# Patient Record
Sex: Female | Born: 1950 | Race: Black or African American | Hispanic: No | Smoking: Former smoker
Health system: Southern US, Community
[De-identification: ages and names within clinical notes are randomized; demographics above are authoritative.]

## PROBLEM LIST (undated history)

## (undated) DIAGNOSIS — F0390 Unspecified dementia without behavioral disturbance: Secondary | ICD-10-CM

## (undated) DIAGNOSIS — R4701 Aphasia: Secondary | ICD-10-CM

## (undated) DIAGNOSIS — G40909 Epilepsy, unspecified, not intractable, without status epilepticus: Secondary | ICD-10-CM

## (undated) DIAGNOSIS — I119 Hypertensive heart disease without heart failure: Secondary | ICD-10-CM

## (undated) DIAGNOSIS — I739 Peripheral vascular disease, unspecified: Secondary | ICD-10-CM

## (undated) DIAGNOSIS — E669 Obesity, unspecified: Secondary | ICD-10-CM

## (undated) DIAGNOSIS — E119 Type 2 diabetes mellitus without complications: Secondary | ICD-10-CM

## (undated) DIAGNOSIS — R1310 Dysphagia, unspecified: Secondary | ICD-10-CM

## (undated) DIAGNOSIS — F329 Major depressive disorder, single episode, unspecified: Secondary | ICD-10-CM

## (undated) DIAGNOSIS — R532 Functional quadriplegia: Secondary | ICD-10-CM

## (undated) DIAGNOSIS — D649 Anemia, unspecified: Secondary | ICD-10-CM

## (undated) DIAGNOSIS — E785 Hyperlipidemia, unspecified: Secondary | ICD-10-CM

---

## 2017-05-11 DIAGNOSIS — I639 Cerebral infarction, unspecified: Secondary | ICD-10-CM

## 2017-05-11 HISTORY — DX: Cerebral infarction, unspecified: I63.9

## 2017-09-02 ENCOUNTER — Other Ambulatory Visit: Payer: Self-pay

## 2017-09-02 ENCOUNTER — Encounter (HOSPITAL_COMMUNITY): Payer: Self-pay

## 2017-09-02 ENCOUNTER — Inpatient Hospital Stay (HOSPITAL_COMMUNITY)
Admission: AD | Admit: 2017-09-02 | Discharge: 2017-09-14 | DRG: 982 | Disposition: A | Discharge status: Skilled Nursing Facility | Payer: Medicare (Managed Care) | Source: Other Acute Inpatient Hospital | Attending: Internal Medicine | Admitting: Internal Medicine

## 2017-09-02 ENCOUNTER — Inpatient Hospital Stay (HOSPITAL_COMMUNITY): Payer: Medicare (Managed Care)

## 2017-09-02 DIAGNOSIS — G40409 Other generalized epilepsy and epileptic syndromes, not intractable, without status epilepticus: Secondary | ICD-10-CM | POA: Diagnosis present

## 2017-09-02 DIAGNOSIS — Z87440 Personal history of urinary (tract) infections: Secondary | ICD-10-CM | POA: Diagnosis not present

## 2017-09-02 DIAGNOSIS — L89159 Pressure ulcer of sacral region, unspecified stage: Secondary | ICD-10-CM | POA: Diagnosis present

## 2017-09-02 DIAGNOSIS — R4701 Aphasia: Secondary | ICD-10-CM | POA: Diagnosis not present

## 2017-09-02 DIAGNOSIS — G931 Anoxic brain damage, not elsewhere classified: Secondary | ICD-10-CM | POA: Diagnosis present

## 2017-09-02 DIAGNOSIS — Z9049 Acquired absence of other specified parts of digestive tract: Secondary | ICD-10-CM

## 2017-09-02 DIAGNOSIS — R569 Unspecified convulsions: Secondary | ICD-10-CM

## 2017-09-02 DIAGNOSIS — I340 Nonrheumatic mitral (valve) insufficiency: Secondary | ICD-10-CM | POA: Diagnosis not present

## 2017-09-02 DIAGNOSIS — I6330 Cerebral infarction due to thrombosis of unspecified cerebral artery: Secondary | ICD-10-CM | POA: Diagnosis not present

## 2017-09-02 DIAGNOSIS — Z888 Allergy status to other drugs, medicaments and biological substances status: Secondary | ICD-10-CM | POA: Diagnosis not present

## 2017-09-02 DIAGNOSIS — I639 Cerebral infarction, unspecified: Principal | ICD-10-CM

## 2017-09-02 DIAGNOSIS — G253 Myoclonus: Secondary | ICD-10-CM | POA: Diagnosis present

## 2017-09-02 DIAGNOSIS — I2510 Atherosclerotic heart disease of native coronary artery without angina pectoris: Secondary | ICD-10-CM | POA: Diagnosis not present

## 2017-09-02 DIAGNOSIS — Z6841 Body Mass Index (BMI) 40.0 and over, adult: Secondary | ICD-10-CM | POA: Diagnosis not present

## 2017-09-02 DIAGNOSIS — I130 Hypertensive heart and chronic kidney disease with heart failure and stage 1 through stage 4 chronic kidney disease, or unspecified chronic kidney disease: Secondary | ICD-10-CM | POA: Diagnosis not present

## 2017-09-02 DIAGNOSIS — E669 Obesity, unspecified: Secondary | ICD-10-CM | POA: Diagnosis not present

## 2017-09-02 DIAGNOSIS — G40401 Other generalized epilepsy and epileptic syndromes, not intractable, with status epilepticus: Secondary | ICD-10-CM | POA: Diagnosis not present

## 2017-09-02 DIAGNOSIS — Z8701 Personal history of pneumonia (recurrent): Secondary | ICD-10-CM | POA: Diagnosis not present

## 2017-09-02 DIAGNOSIS — E1151 Type 2 diabetes mellitus with diabetic peripheral angiopathy without gangrene: Secondary | ICD-10-CM | POA: Diagnosis present

## 2017-09-02 DIAGNOSIS — G252 Other specified forms of tremor: Secondary | ICD-10-CM | POA: Diagnosis not present

## 2017-09-02 DIAGNOSIS — Z91041 Radiographic dye allergy status: Secondary | ICD-10-CM

## 2017-09-02 DIAGNOSIS — N183 Chronic kidney disease, stage 3 (moderate): Secondary | ICD-10-CM | POA: Diagnosis not present

## 2017-09-02 DIAGNOSIS — I5022 Chronic systolic (congestive) heart failure: Secondary | ICD-10-CM | POA: Diagnosis present

## 2017-09-02 DIAGNOSIS — G8220 Paraplegia, unspecified: Secondary | ICD-10-CM | POA: Diagnosis not present

## 2017-09-02 DIAGNOSIS — Z931 Gastrostomy status: Secondary | ICD-10-CM | POA: Diagnosis not present

## 2017-09-02 DIAGNOSIS — Z91018 Allergy to other foods: Secondary | ICD-10-CM | POA: Diagnosis not present

## 2017-09-02 DIAGNOSIS — E1122 Type 2 diabetes mellitus with diabetic chronic kidney disease: Secondary | ICD-10-CM | POA: Diagnosis present

## 2017-09-02 DIAGNOSIS — D649 Anemia, unspecified: Secondary | ICD-10-CM | POA: Diagnosis present

## 2017-09-02 DIAGNOSIS — J45909 Unspecified asthma, uncomplicated: Secondary | ICD-10-CM | POA: Diagnosis present

## 2017-09-02 DIAGNOSIS — G9340 Encephalopathy, unspecified: Secondary | ICD-10-CM | POA: Diagnosis not present

## 2017-09-02 DIAGNOSIS — R1310 Dysphagia, unspecified: Secondary | ICD-10-CM | POA: Diagnosis not present

## 2017-09-02 DIAGNOSIS — Z7401 Bed confinement status: Secondary | ICD-10-CM

## 2017-09-02 DIAGNOSIS — R52 Pain, unspecified: Secondary | ICD-10-CM

## 2017-09-02 DIAGNOSIS — Z91013 Allergy to seafood: Secondary | ICD-10-CM

## 2017-09-02 DIAGNOSIS — Z751 Person awaiting admission to adequate facility elsewhere: Secondary | ICD-10-CM

## 2017-09-02 DIAGNOSIS — E785 Hyperlipidemia, unspecified: Secondary | ICD-10-CM | POA: Diagnosis present

## 2017-09-02 DIAGNOSIS — R29721 NIHSS score 21: Secondary | ICD-10-CM | POA: Diagnosis present

## 2017-09-02 DIAGNOSIS — R4182 Altered mental status, unspecified: Secondary | ICD-10-CM

## 2017-09-02 LAB — CBC WITH DIFFERENTIAL/PLATELET
BASOS ABS: 0.1 10*3/uL (ref 0.0–0.1)
Basophils Relative: 1 %
Eosinophils Absolute: 0.8 10*3/uL — ABNORMAL HIGH (ref 0.0–0.7)
Eosinophils Relative: 8 %
HEMATOCRIT: 33.8 % — AB (ref 36.0–46.0)
HEMOGLOBIN: 10.6 g/dL — AB (ref 12.0–15.0)
Lymphocytes Relative: 35 %
Lymphs Abs: 3.4 10*3/uL (ref 0.7–4.0)
MCH: 26.6 pg (ref 26.0–34.0)
MCHC: 31.4 g/dL (ref 30.0–36.0)
MCV: 84.7 fL (ref 78.0–100.0)
Monocytes Absolute: 0.8 10*3/uL (ref 0.1–1.0)
Monocytes Relative: 8 %
NEUTROS ABS: 4.8 10*3/uL (ref 1.7–7.7)
NEUTROS PCT: 48 %
PLATELETS: 390 10*3/uL (ref 150–400)
RBC: 3.99 MIL/uL (ref 3.87–5.11)
RDW: 15.1 % (ref 11.5–15.5)
WBC: 9.8 10*3/uL (ref 4.0–10.5)

## 2017-09-02 LAB — MRSA PCR SCREENING: MRSA by PCR: POSITIVE — AB

## 2017-09-02 LAB — COMPREHENSIVE METABOLIC PANEL
ALBUMIN: 3.1 g/dL — AB (ref 3.5–5.0)
ALT: 12 U/L — ABNORMAL LOW (ref 14–54)
ANION GAP: 10 (ref 5–15)
AST: 15 U/L (ref 15–41)
Alkaline Phosphatase: 94 U/L (ref 38–126)
BUN: 14 mg/dL (ref 6–20)
CHLORIDE: 102 mmol/L (ref 101–111)
CO2: 26 mmol/L (ref 22–32)
Calcium: 9.7 mg/dL (ref 8.9–10.3)
Creatinine, Ser: 0.71 mg/dL (ref 0.44–1.00)
GFR calc non Af Amer: >60 mL/min (ref >60)
Glucose, Bld: 173 mg/dL — ABNORMAL HIGH (ref 65–99)
Potassium: 3.8 mmol/L (ref 3.5–5.1)
SODIUM: 138 mmol/L (ref 135–145)
Total Bilirubin: 0.7 mg/dL (ref 0.3–1.2)
Total Protein: 7.2 g/dL (ref 6.5–8.1)

## 2017-09-02 LAB — PROTIME-INR
INR: 1.01
Prothrombin Time: 13.2 seconds (ref 11.4–15.2)

## 2017-09-02 LAB — MAGNESIUM: Magnesium: 1.7 mg/dL (ref 1.7–2.4)

## 2017-09-02 LAB — GLUCOSE, CAPILLARY
GLUCOSE-CAPILLARY: 203 mg/dL — AB (ref 65–99)
Glucose-Capillary: 166 mg/dL — ABNORMAL HIGH (ref 65–99)

## 2017-09-02 LAB — PHOSPHORUS: PHOSPHORUS: 4 mg/dL (ref 2.5–4.6)

## 2017-09-02 LAB — CK: CK TOTAL: 34 U/L — AB (ref 38–234)

## 2017-09-02 LAB — AMMONIA: AMMONIA: 21 umol/L (ref 9–35)

## 2017-09-02 MED ORDER — LORAZEPAM 2 MG/ML IJ SOLN
2.0000 mg | Freq: Once | INTRAMUSCULAR | Status: DC | PRN
Start: 2017-09-02 — End: 2017-09-14

## 2017-09-02 MED ORDER — HEPARIN SODIUM (PORCINE) 5000 UNIT/ML IJ SOLN
5000.0000 [IU] | Freq: Three times a day (TID) | INTRAMUSCULAR | Status: DC
  Administered 2017-09-02 – 2017-09-14 (×36): 5000 [IU] via SUBCUTANEOUS
  Filled 2017-09-02 (×36): qty 1

## 2017-09-02 MED ORDER — IPRATROPIUM-ALBUTEROL 0.5-2.5 (3) MG/3ML IN SOLN: 3.0000 mL | Freq: Four times a day (QID) | RESPIRATORY_TRACT | Status: DC | PRN

## 2017-09-02 MED ORDER — ATORVASTATIN CALCIUM 20 MG PO TABS
20.0000 mg | ORAL_TABLET | Freq: Every day | ORAL | Status: DC
  Administered 2017-09-03 – 2017-09-04 (×2): 20 mg via ORAL
  Filled 2017-09-02 (×2): qty 1

## 2017-09-02 MED ORDER — CARVEDILOL 3.125 MG PO TABS
3.1250 mg | ORAL_TABLET | Freq: Two times a day (BID) | ORAL | Status: DC
  Administered 2017-09-03 – 2017-09-06 (×7): 3.125 mg
  Filled 2017-09-02 (×7): qty 1

## 2017-09-02 MED ORDER — LEVETIRACETAM IN NACL 1000 MG/100ML IV SOLN
1000.0000 mg | Freq: Two times a day (BID) | INTRAVENOUS | Status: DC
  Administered 2017-09-02 – 2017-09-08 (×12): 1000 mg via INTRAVENOUS
  Filled 2017-09-02 (×13): qty 100

## 2017-09-02 MED ORDER — MONTELUKAST SODIUM 10 MG PO TABS
10.0000 mg | ORAL_TABLET | Freq: Every day | ORAL | Status: DC
  Administered 2017-09-03 – 2017-09-13 (×12): 10 mg
  Filled 2017-09-02 (×12): qty 1

## 2017-09-02 MED ORDER — INSULIN ASPART 100 UNIT/ML ~~LOC~~ SOLN
1.0000 [IU] | SUBCUTANEOUS | Status: DC
  Administered 2017-09-03 (×2): 2 [IU] via SUBCUTANEOUS
  Administered 2017-09-03: 1 [IU] via SUBCUTANEOUS
  Administered 2017-09-03 (×2): 2 [IU] via SUBCUTANEOUS
  Administered 2017-09-04 (×3): 1 [IU] via SUBCUTANEOUS
  Administered 2017-09-04: 2 [IU] via SUBCUTANEOUS
  Administered 2017-09-05 (×2): 1 [IU] via SUBCUTANEOUS
  Administered 2017-09-05: 2 [IU] via SUBCUTANEOUS
  Administered 2017-09-05: 1 [IU] via SUBCUTANEOUS
  Administered 2017-09-06 (×7): 2 [IU] via SUBCUTANEOUS
  Administered 2017-09-07: 3 [IU] via SUBCUTANEOUS
  Administered 2017-09-07: 2 [IU] via SUBCUTANEOUS
  Administered 2017-09-07: 3 [IU] via SUBCUTANEOUS
  Administered 2017-09-07: 2 [IU] via SUBCUTANEOUS

## 2017-09-02 NOTE — H&P (Signed)
PULMONARY / CRITICAL CARE MEDICINE   Name: Christine Franklin MRN: [id number] DOB: 11/09/1950    ADMISSION DATE:  09/02/2017 CONSULTATION DATE:  09/02/17  REFERRING MD:  Dr. Julio Sickssei-Bonsu at [facility]  CHIEF COMPLAINT:  Acute encephalopathy, concern for SLSE  HISTORY OF PRESENT ILLNESS:   HPI obtained from from patient's daughter and medical records as patient is encephalopathic and unable to provide history.   67 year old female with past medical significant for DM, CAD, CKD stage 3, HLD, obesity who presents from [facility] with concern for subclinical status epilepticus.   Patient lived and functioned independently prior to being originally hospitalized in January after being found unresponsive on her CPAP requiring intubation and treatment for pneumonia.  Since that time she has been admitted and intubated twice being treated for sepsis related to pneumonia and UTI.  Since January, she has continued to have this ongoing encephalopathy which waxes and wanes per her daughter, medical records note some concern for anoxic injury.     Patient was in general state of health today with her daughter was visiting, when she yelled out, looked to her right and started generalized jerking for around 2 minutes.  This was not witnessed by staff however patient remained altered from her baseline mental status and non-verbal.  No recent known fever or other complaints.  Her daughter states she is intermittently able to communicate and speak in sentences and follow commands at baseline; [facility] notes she is at baseline oriented to self, intermittently follows commands, and moves all extremities.  Following event she was taken for head CT which was negative, transferred to [facility] ICU, started on Keppra 500 mg  BID and EEG performed.  Results of EEG unknown.  [facility] in concern for status epilepticus as patient has been unable to return to her baseline mental status.  PCCM to  admit.  PAST MEDICAL HISTORY :  She  has no past medical history on file.  PAST SURGICAL HISTORY: She  has no past surgical history on file.  Allergies not on file  No current facility-administered medications on file prior to encounter.    No current outpatient medications on file prior to encounter.    FAMILY HISTORY:  Her has no family status information on file.    SOCIAL HISTORY: Unable  REVIEW OF SYSTEMS:   Unable  SUBJECTIVE:   VITAL SIGNS: BP (!) 82/61 (BP Location: Right Leg)   Pulse (!) 101   Temp 99.2 F (37.3 C) (Axillary)   Resp 15   Ht 5\' 6"  (1.676 m)   Wt 228 lb 6.3 oz (103.6 kg)   SpO2 100%   BMI 36.86 kg/m   HEMODYNAMICS:    VENTILATOR SETTINGS:    INTAKE / OUTPUT: No intake/output data recorded.  PHYSICAL EXAMINATION: General:  Chronically ill appearing elderly female lying in bed in NAD HEENT: MM pink/moist, pupils 4 reactive, anicteric, no JVD Neuro: Awake, vocalizes "yes ma'am" only to all questions, does not follow commands, moves all extremities spontaneously, questionably patient will not cross midline to right, family states she favors her left side.   CV  rrr, no m/r/g PULM: even/non-labored on 2L [place] at 100%, lungs bilaterally clear anteriorly, diminished in bases GI: obese, soft, non-tender, bs active, Gtube  Extremities: warm/dry, trace BLE edema  Skin: no rashes   LABS:  BMET No results for input(s): NA, K, CL, CO2, BUN, CREATININE, GLUCOSE in the last 168 hours.  Electrolytes No results for input(s):  CALCIUM, MG, PHOS in the last 168 hours.  CBC No results for input(s): WBC, HGB, HCT, PLT in the last 168 hours.  Coag's No results for input(s): APTT, INR in the last 168 hours.  Sepsis Markers No results for input(s): LATICACIDVEN, PROCALCITON, O2SATVEN in the last 168 hours.  ABG No results for input(s): PHART, PCO2ART, PO2ART in the last 168 hours.  Liver Enzymes No results for input(s): AST, ALT, ALKPHOS,  BILITOT, ALBUMIN in the last 168 hours.  Cardiac Enzymes No results for input(s): TROPONINI, PROBNP in the last 168 hours.  Glucose Recent Labs  Lab 09/02/17 2121  GLUCAP 203*    Imaging No results found.   STUDIES:  4/25 CTH (at [facility])>> non acute  CULTURES: 4/25 BC x 2 >>  ANTIBIOTICS: none  SIGNIFICANT EVENTS: 4/25 Admitted  LINES/TUBES: PIV x 2   DISCUSSION: 73 yoF with chronic encephalopathy sent from [facility] after witnessed seizure episode.  No prior seizure hx.  CTH negative.  Patient has not returned to her prior baseline with concern for SCSE and therefore transferred to [facility] for LTM.   ASSESSMENT / PLAN:  PULMONARY A: At risk for respiratory insufficiency given acute on chronic encephalopathy and concern for SCSE - currently protecting her airway, minimal O2 requirements. P:   Monitor  Supplemental O2 prn for sats > 94 Duonebs q 6 prn CXR for baseline Continue singular daily   CARDIOVASCULAR A:  Hx systolic HF, HLD - currently hemodynamically stable P:  Tele monitor Goal MAP > 65 Resume carvedilol and  lipitor in am  RENAL A:   No known acute issues P:   Monitor for urinary retention Assess BMET, mag, and phos Trend daily wts/ urinary output Replace electrolytes as indicated  GASTROINTESTINAL A:   Dysphagia s/p Gtube P:   NPO for now, hold TFs and q 4 free water  SUP not indicated Assess LFTs  HEMATOLOGIC A:   No known acute process - afebrile P:  Cbc now and trend Heparin SQ and SCD for VTE  INFECTIOUS A:   No acute known issues  Sacral/ R buttock decub Hx recurrent PNA/ UTI - ? Ongoing aspiration P:   Assess CBC, UA, CXR WOC consult for sacral decub Assess blood cultures for baseline Trend WBC/ fever curve  ENDOCRINE A:   DM  P:   CBG q 4 with SSI  NEUROLOGIC A:   Seizure- witnessed, jerking for 2 mins by daughter, concern for SCSE, as patient has not returned to her baseline - no prior hx of  seizures - neg CTH at [facility] 4/25 - EEG performed at [facility] 4/25, results unknown, attempted to call 4/25 pm, unavailable Chronic encephalopathy/ anoxic injury P:   ICU monitoring Neurology consulted Pending LTM Keppra 1gm BID  Hourly neuro checks Consider MRI Seizure precautions, ativan 2mg  x 1 if needed  Assess CK, ammonia, UA, LFTs, CBC  FAMILY  - Updates: Patient's daughter, Sable Feil [phone], at bedside, updated on plan of care   - Inter-disciplinary family meet or Palliative Care meeting due by:  4/2  CCT 60 mins  Posey Boyer, AGACNP-BC [facility] Pgr: [phone] or if no answer [phone] 09/02/2017, 11:26 PM

## 2017-09-03 ENCOUNTER — Inpatient Hospital Stay (HOSPITAL_COMMUNITY): Payer: Medicare (Managed Care)

## 2017-09-03 DIAGNOSIS — R569 Unspecified convulsions: Secondary | ICD-10-CM

## 2017-09-03 DIAGNOSIS — D649 Anemia, unspecified: Secondary | ICD-10-CM | POA: Diagnosis present

## 2017-09-03 DIAGNOSIS — G253 Myoclonus: Secondary | ICD-10-CM | POA: Diagnosis present

## 2017-09-03 DIAGNOSIS — G9340 Encephalopathy, unspecified: Secondary | ICD-10-CM

## 2017-09-03 DIAGNOSIS — G40409 Other generalized epilepsy and epileptic syndromes, not intractable, without status epilepticus: Secondary | ICD-10-CM | POA: Diagnosis present

## 2017-09-03 LAB — POCT I-STAT 3, ART BLOOD GAS (G3+)
Acid-Base Excess: 2 mmol/L (ref 0.0–2.0)
BICARBONATE: 24.6 mmol/L (ref 20.0–28.0)
O2 Saturation: 98 %
PO2 ART: 95 mmHg (ref 83.0–108.0)
TCO2: 25 mmol/L (ref 22–32)
pCO2 arterial: 28.9 mmHg — ABNORMAL LOW (ref 32.0–48.0)
pH, Arterial: 7.538 — ABNORMAL HIGH (ref 7.350–7.450)

## 2017-09-03 LAB — GLUCOSE, CAPILLARY
GLUCOSE-CAPILLARY: 145 mg/dL — AB (ref 65–99)
GLUCOSE-CAPILLARY: 114 mg/dL — AB (ref 65–99)
Glucose-Capillary: 157 mg/dL — ABNORMAL HIGH (ref 65–99)
Glucose-Capillary: 152 mg/dL — ABNORMAL HIGH (ref 65–99)
Glucose-Capillary: 152 mg/dL — ABNORMAL HIGH (ref 65–99)

## 2017-09-03 LAB — URINALYSIS, ROUTINE W REFLEX MICROSCOPIC
Bilirubin Urine: NEGATIVE
Glucose, UA: NEGATIVE mg/dL
HGB URINE DIPSTICK: NEGATIVE
KETONES UR: 5 mg/dL — AB
NITRITE: NEGATIVE
Protein, ur: 100 mg/dL — AB
RBC / HPF: >50 RBC/hpf — ABNORMAL HIGH (ref 0–5)
Specific Gravity, Urine: 1.02 (ref 1.005–1.030)
WBC, UA: >50 WBC/hpf — ABNORMAL HIGH (ref 0–5)
pH: 7 (ref 5.0–8.0)

## 2017-09-03 LAB — HIV ANTIBODY (ROUTINE TESTING W REFLEX): HIV SCREEN 4TH GENERATION: NONREACTIVE

## 2017-09-03 LAB — TSH: TSH: 0.456 u[IU]/mL (ref 0.350–4.500)

## 2017-09-03 MED ORDER — SODIUM CHLORIDE 0.9 % IV SOLN
INTRAVENOUS | Status: DC
  Administered 2017-09-03 – 2017-09-06 (×4): via INTRAVENOUS

## 2017-09-03 MED ORDER — MUPIROCIN 2 % EX OINT
1.0000 "application " | TOPICAL_OINTMENT | Freq: Two times a day (BID) | CUTANEOUS | Status: AC
  Administered 2017-09-03 – 2017-09-07 (×10): 1 via NASAL
  Filled 2017-09-03: qty 22

## 2017-09-03 MED ORDER — CHLORHEXIDINE GLUCONATE 0.12 % MT SOLN
15.0000 mL | Freq: Two times a day (BID) | OROMUCOSAL | Status: DC
  Administered 2017-09-03 – 2017-09-14 (×23): 15 mL via OROMUCOSAL
  Filled 2017-09-03 (×21): qty 15

## 2017-09-03 MED ORDER — CHLORHEXIDINE GLUCONATE CLOTH 2 % EX PADS
6.0000 | MEDICATED_PAD | Freq: Every day | CUTANEOUS | Status: AC
  Administered 2017-09-03 – 2017-09-07 (×5): 6 via TOPICAL

## 2017-09-03 MED ORDER — ORAL CARE MOUTH RINSE
15.0000 mL | Freq: Two times a day (BID) | OROMUCOSAL | Status: DC
  Administered 2017-09-03 – 2017-09-14 (×22): 15 mL via OROMUCOSAL

## 2017-09-03 NOTE — Consult Note (Signed)
NEURO HOSPITALIST CONSULT NOTE   Requestig physician: Dr. Julio Sickssei-Bonsu  Reason for Consult: Possible seizures  History obtained from:   Chart    HPI:                                                                                                                                          Christine Franklin is an 67 y.o. female who was at her baseline level of health, functioning independetly in January 2019 when she was found unresponsive on her home CPAP machine and admitted to the hospital. She had been intubated with that hospitalization, but when extubated she failed swallow evaluation and was determined to be cognitively impaired. 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 After the hospitalization she was discharged to a SNF and has had recurrent UTIs, sepsis and pneumonia requiring 2 intubations. Since January, she has continued to have an ongoing, waxing/waning encephalopathy.     Yesterday at the SNF, the patient was at her baseline initially while her daughter was visiting, when she yelled out, looked to her right and then exhibited generalized jerking for around 2 minutes. The patient then remained altered from her baseline mental status and was non-verbal.  Her daughter states she is intermittently able to communicate and speak in sentences and follow commands at baseline. She has not had any known fever or other complaints, recently. Per the SNF notes at baseline she is oriented to self, intermittently follows commands, and moves all extremities.  Following the above event she was taken for a head CT which was negative, transferred to the [facility] ICU, started on Keppra 500 mg BID without a load and EEG performed.  Results of EEG unknown, but per report there was some mention of "myoclonus".  She was transferred [facility] for continuous EEG with concern for possible status epilepticus.  PMHx HTN DM Disorder of lipid  metabolism Asthma CKD CAD  PSHx Hx heart cath Cholecystectomy Hysterotomy   Social History:  reports that she has never smoked. She has never used smokeless tobacco. Her alcohol and drug histories are not on file.  Not on File  MEDICATIONS:  Scheduled: . atorvastatin  20 mg Oral q1800  . carvedilol  3.125 mg Per Tube BID WC  . chlorhexidine  15 mL Mouth Rinse BID  . heparin  5,000 Units Subcutaneous Q8H  . insulin aspart  1-3 Units Subcutaneous Q4H  . mouth rinse  15 mL Mouth Rinse q12n4p  . montelukast  10 mg Per Tube QHS   Continuous: . levETIRAcetam Stopped (09/02/17 2311)     ROS:                                                                                                                                       Unable to obtain due to AMS.    Blood pressure 108/69, pulse 88, temperature 98.9 F (37.2 C), temperature source Oral, resp. rate 19, height 5\' 6"  (1.676 m), weight 103.6 kg (228 lb 6.3 oz), SpO2 100 %.   General Examination:                                                                                                      Physical Exam  HEENT-  [place]/AT    Lungs - respirations unlabored Extremities- Dressings to ulcers heels bilaterally.   Neurological Examination Mental Status: Obtunded. Not responding to commands. Keeps eyes closed when attempting to open manually, but briefly opened to command. Does not follow any other commands. Flexes upper extremities and exhibits some localization behavior to pain. No verbal output Cranial Nerves: II:  Shuts eyes tightly when attempting to assess light reflex. Does not blink to threat or fixate.   III,IV, VI: Eyes conjugate without forced gaze deviation VII: Grimace symmetric to noxious  VIII: Opened eyes 1x to command IX,X: Does not open mouth for assessment XI: Head at midline XII: Does not open  mouth for assessment Motor/Sensory: Flexes upper extremities symmetrically antigravity and exhibits some localization behavior to pain Tonically extended lower extremities, which increases to pain symmetrically  Deep Tendon Reflexes:  3+ biceps and brachioradialis bilaterally Trace patellar reflexes 0 achilles bilaterally Plantars: Mute bilaterally Cerebellar/Gait: Unable to assess    Lab Results: Basic Metabolic Panel: Recent Labs  Lab 09/02/17 2226  NA 138  K 3.8  CL 102  CO2 26  GLUCOSE 173*  BUN 14  CREATININE 0.71  CALCIUM 9.7  MG 1.7  PHOS 4.0    CBC: Recent Labs  Lab 09/02/17 2226  WBC 9.8  NEUTROABS 4.8  HGB 10.6*  HCT 33.8*  MCV 84.7  PLT 390    Cardiac Enzymes:  Recent Labs  Lab 09/02/17 2244  CKTOTAL 34*    Lipid Panel: No results for input(s): CHOL, TRIG, HDL, CHOLHDL, VLDL, LDLCALC in the last 168 hours.  Imaging: Dg Chest Port 1 View  Result Date: 09/02/2017 CLINICAL DATA:  Altered mental status EXAM: PORTABLE CHEST 1 VIEW COMPARISON:  None. FINDINGS: Heart is top-normal in size. There is moderate aortic atherosclerosis without aneurysm. Upper lobe predominant emphysematous hyperinflation with crowding of lower lobe interstitial lung markings. Subpleural atelectasis and/or scarring is seen in the left mid lung. No acute osseous abnormality. IMPRESSION: 1. Upper lobe predominant emphysematous hyperinflation of the lungs with crowding of lower lobe interstitial lung markings. No active pulmonary disease. 2. Aortic atherosclerosis. Electronically Signed   By: Tollie Eth M.D.   On: 09/02/2017 23:30    Assessment: 67 year old female presenting with AMS relative to her cognitively impaired baseline.  1. EEG reviewed at the bedside with symmetric smoothly contoured delta rhythm alternating with more flat activity, consistent with mixed encephalopathy and sleep. No electrographic seizures seen.  2. Exam consistent with severe diffuse encephalopathy. No  myoclonus or other clinical seizure activity noted.  3. Overall exam and EEG findings not consistent with nonconvulsive status epilepticus at the time of evaluation.  4. Most likely in an extended postictal state from seizure. Unclear what the precipitant for the seizure was based upon current information  Recommendations: 1. Supplemental Keppra load 1000 mg IV has been administered. Continue at 1000 mg BID 2. Continue LTM EEG 3. MRI brain 4. Frequent neuro checks   40 minutes of time spent in the neurological evaluation and management of this critically ill patient. Time spent included evaluation of bedside EEG tracings.   Electronically signed: Dr. Caryl Pina 09/03/2017, 4:17 AM

## 2017-09-03 NOTE — Procedures (Signed)
LTM-EEG Report  HISTORY: Continuous video-EEG monitoring performed for [age] year old with altered mental status, myoclonus.  ACQUISITION: International 10-20 system for electrode placement; 18 channels with additional eyes linked to ipsilateral ears and EKG. Additional T1-T2 electrodes were used. Continuous video recording obtained.   EEG NUMBER:  MEDICATIONS:  Day 1: LEV  DAY #1: from 0112 09/03/17 to 0730 09/03/17 BACKGROUND: An overall medium voltage continuous recording with some spontaneous variability and reactivity. The resting background consisted of medium voltage semirhythmic theta-delta activity bilaterally with sparse superimposed faster frequencies and no clear sleep architecture. State changes were seen with atypical arousal patterns characterized by increased rhythmic delta activity and voltage. There was no evidence of a posterior basic rhythm. EPILEPTIFORM/PERIODIC ACTIVITY: none  SEIZURES: none EVENTS: none  EKG: no significant arrhythmia  SUMMARY: This was a moderately abnormal continuous video EEG due to loss of normal background features, atypical arousal patterns, and generalized slow activity, indicative of an encephalopathy pattern. There were no epileptiform discharges or seizures.

## 2017-09-03 NOTE — Progress Notes (Signed)
Interim Note  Patient lethargic, would withdraw to noxious stimulus. Non verbal and not following commands.  LTM EEG did not show seizure activity - will discontinue EEG monitoring.

## 2017-09-03 NOTE — Progress Notes (Signed)
vLTM EEG complete. No skin breakdown 

## 2017-09-03 NOTE — Progress Notes (Signed)
PULMONARY / CRITICAL CARE MEDICINE   Name: Christine Franklin MRN: [id number] DOB: [birth year]    ADMISSION DATE:  09/02/2017 CONSULTATION DATE:  09/02/17  REFERRING MD:  Dr. Julio Sicks at [facility]  CHIEF COMPLAINT:  Acute encephalopathy, concern for SLSE  HISTORY OF PRESENT ILLNESS:   HPI obtained from from patient's daughter and medical records as patient is encephalopathic and unable to provide history.   67 year old female with past medical significant for DM, CAD, CKD stage 3, HLD, obesity who presents from [facility] with concern for subclinical status epilepticus.   Patient lived and functioned independently prior to being originally hospitalized in January after being found unresponsive on her CPAP requiring intubation and treatment for pneumonia.  Since that time she has been admitted and intubated twice being treated for sepsis related to pneumonia and UTI.  Since January, she has continued to have this ongoing encephalopathy which waxes and wanes per her daughter, medical records note some concern for anoxic injury.     Patient was in general state of health today with her daughter was visiting, when she yelled out, looked to her right and started generalized jerking for around 2 minutes.  This was not witnessed by staff however patient remained altered from her baseline mental status and non-verbal.  No recent known fever or other complaints.  Her daughter states she is intermittently able to communicate and speak in sentences and follow commands at baseline; [facility] notes she is at baseline oriented to self, intermittently follows commands, and moves all extremities.  Following event she was taken for head CT which was negative, transferred to [facility] ICU, started on Keppra 500 mg  BID and EEG performed.  Results of EEG unknown.  She was transferred to [facility] for continuous EEG in concern for status epilepticus as patient has been unable to return to her baseline mental status.  PCCM to  admit.  She has had no witnessed EEG or clinical seizure activity overnight.  She has not had any cough or respiratory difficulties.  She has spontaneous eye opening but it is not at all interactive.  PAST MEDICAL HISTORY :  She  has no past medical history on file.  PAST SURGICAL HISTORY: She  has no past surgical history on file.  Not on File  No current facility-administered medications on file prior to encounter.    No current outpatient medications on file prior to encounter.    FAMILY HISTORY:  Her has no family status information on file.    SOCIAL HISTORY: Unable  REVIEW OF SYSTEMS:   Unable  SUBJECTIVE:   VITAL SIGNS: BP 134/64   Pulse 73   Temp 99 F (37.2 C) (Axillary)   Resp 18   Ht 5\' 6"  (1.676 m)   Wt 229 lb 4.5 oz (104 kg)   SpO2 95%   BMI 37.01 kg/m   HEMODYNAMICS:    VENTILATOR SETTINGS:    INTAKE / OUTPUT: No intake/output data recorded.  PHYSICAL EXAMINATION: General: Obese female in no acute distress  HEENT: MM pink/moist, pupils 4 reactive, anicteric, no JVD Neuro: Eyes are open but she does not track or follow instructions.  She has several facial automatisms with continuous blinking and chewing but at the same time she is purposeful with her left upper extremity.     CV S1 and S2 are regular without murmur rub or gallop.   PULM: Aspirations are unlabored, there is symmetric air movement, there is no wheezes.   GI: Abdomen is obese soft  and nontender without any overt organomegaly masses or tenderness.  PEG tube is present.    Extremities: warm/dry, trace BLE edema  Skin: no rashes   LABS:  BMET Recent Labs  Lab 09/02/17 2226  NA 138  K 3.8  CL 102  CO2 26  BUN 14  CREATININE 0.71  GLUCOSE 173*    Electrolytes Recent Labs  Lab 09/02/17 2226  CALCIUM 9.7  MG 1.7  PHOS 4.0    CBC Recent Labs  Lab 09/02/17 2226  WBC 9.8  HGB 10.6*  HCT 33.8*  PLT 390    Coag's Recent Labs  Lab 09/02/17 2226  INR 1.01     Sepsis Markers No results for input(s): LATICACIDVEN, PROCALCITON, O2SATVEN in the last 168 hours.  ABG No results for input(s): PHART, PCO2ART, PO2ART in the last 168 hours.  Liver Enzymes Recent Labs  Lab 09/02/17 2226  AST 15  ALT 12*  ALKPHOS 94  BILITOT 0.7  ALBUMIN 3.1*    Cardiac Enzymes No results for input(s): TROPONINI, PROBNP in the last 168 hours.  Glucose Recent Labs  Lab 09/02/17 2121 09/02/17 2325 09/03/17 0352 09/03/17 0823 09/03/17 1222  GLUCAP 203* 166* 145* 157* 152*    Imaging Dg Chest Port 1 View  Result Date: 09/02/2017 CLINICAL DATA:  Altered mental status EXAM: PORTABLE CHEST 1 VIEW COMPARISON:  None. FINDINGS: Heart is top-normal in size. There is moderate aortic atherosclerosis without aneurysm. Upper lobe predominant emphysematous hyperinflation with crowding of lower lobe interstitial lung markings. Subpleural atelectasis and/or scarring is seen in the left mid lung. No acute osseous abnormality. IMPRESSION: 1. Upper lobe predominant emphysematous hyperinflation of the lungs with crowding of lower lobe interstitial lung markings. No active pulmonary disease. 2. Aortic atherosclerosis. Electronically Signed   By: Tollie Ethavid  Kwon M.D.   On: 09/02/2017 23:30     STUDIES:  4/25 CTH (at [facility])>> non acute  CULTURES: 4/25 BC x 2 >>  ANTIBIOTICS: none  SIGNIFICANT EVENTS: 4/25 Admitted  LINES/TUBES: PIV x 2   DISCUSSION: [age] yoF with chronic encephalopathy [facility] from [facility] after witnessed seizure episode.  No prior seizure hx.  CTH negative.  Patient has not returned to her prior baseline with concern for SCSE and therefore [facility].   ASSESSMENT / PLAN:  PULMONARY A: At risk for respiratory insufficiency given acute on chronic encephalopathy and concern for SCSE - currently protecting her airway, minimal O2 requirements. P:   Monitor  Supplemental O2 prn for sats > 94 Duonebs q 6 prn CXR for  baseline Continue singular daily   CARDIOVASCULAR A:  Hx systolic HF, HLD - currently hemodynamically stable P:  Tele monitor Goal MAP > 65 Resume carvedilol and  lipitor in am  RENAL A:   No known acute issues P:   Monitor for urinary retention Assess BMET, mag, and phos Trend daily wts/ urinary output Replace electrolytes as indicated  GASTROINTESTINAL A:   Dysphagia s/p Gtube P:   NPO for now, hold TFs and q 4 free water  SUP not indicated Assess LFTs  HEMATOLOGIC A:   No known acute process - afebrile P:  Cbc now and trend Heparin SQ and SCD for VTE  INFECTIOUS A:   No acute known issues  Sacral/ R buttock decub Hx recurrent PNA/ UTI - ? Ongoing aspiration P:   Assess CBC, UA, CXR WOC consult for sacral decub Assess blood cultures for baseline Trend WBC/ fever curve  ENDOCRINE A:   DM  P:   CBG q 4 with SSI  NEUROLOGIC A:   Seizure- witnessed, jerking for 2 mins by daughter, concern for SCSE, as patient has not returned to her baseline So far EEG here is negative, and she has had no clinical seizure activity.  Search for metabolic provocations for altered mental status is included normal electrolytes, normal LFTs, normal serum ammonia, and a normal white count.  A TSH and blood gas are still pending.  Unfortunately I do not have a list of the medicine she was receiving at [facility] to know if any of them were potentially suppressing her mental state.   Pgr: [phone] or if no answer [phone] 09/03/2017, 12:57 PM

## 2017-09-03 NOTE — Consult Note (Addendum)
WOC Nurse wound consult note Reason for Consult: Consult requested for buttocks.  Family member at the bedside assessed wound appearance and states it was present prior to admission.   Wound type: Patchy areas of pink partial thickness wounds and pink dry scar tissue from previous wounds which have healed.  Appearance is consistent with moisture associated skin damage, NOT pressure injuries.  Several sites across bilat buttocks; all are .2X.2X.1cm or smaller in size.  Pt has a Purewick in place to attempt to contain urine and is on a low airloss bed to reduce pressure.   Foam dressing to protect from further injury. Pt is critically ill and immobile and has multiple systemic factors which can impair healing. Discussed plan of care with family at the bedside. Please re-consult if further assistance is needed.  Thank-you,  Cammie Mcgeeawn Dalia Jollie MSN, RN, CWOCN, [place], CNS [phone]

## 2017-09-03 NOTE — Progress Notes (Signed)
LTM EEG initiated.  Dr. Otelia LimesLindzen aware.

## 2017-09-04 ENCOUNTER — Inpatient Hospital Stay (HOSPITAL_COMMUNITY): Payer: Medicare (Managed Care)

## 2017-09-04 LAB — GLUCOSE, CAPILLARY
GLUCOSE-CAPILLARY: 132 mg/dL — AB (ref 65–99)
GLUCOSE-CAPILLARY: 111 mg/dL — AB (ref 65–99)
Glucose-Capillary: 118 mg/dL — ABNORMAL HIGH (ref 65–99)
Glucose-Capillary: 131 mg/dL — ABNORMAL HIGH (ref 65–99)
Glucose-Capillary: 146 mg/dL — ABNORMAL HIGH (ref 65–99)
Glucose-Capillary: 163 mg/dL — ABNORMAL HIGH (ref 65–99)

## 2017-09-04 MED ORDER — HYDRALAZINE HCL 20 MG/ML IJ SOLN
5.0000 mg | INTRAMUSCULAR | Status: DC | PRN
  Administered 2017-09-04 – 2017-09-05 (×3): 5 mg via INTRAVENOUS
  Filled 2017-09-04 (×4): qty 1

## 2017-09-04 NOTE — Progress Notes (Signed)
PULMONARY / CRITICAL CARE MEDICINE   Name: Christine Franklin MRN: [id number] DOB: [birth year]    ADMISSION DATE:  09/02/2017 CONSULTATION DATE:  09/02/17  REFERRING MD:  Dr. Julio Sicks at [facility]  CHIEF COMPLAINT:  Acute encephalopathy, concern for SLSE  HISTORY OF PRESENT ILLNESS:   HPI obtained from from patient's daughter and medical records as patient is encephalopathic and unable to provide history.   67 year old female with past medical significant for DM, CAD, CKD stage 3, HLD, obesity who presents from [facility] with concern for subclinical status epilepticus.   Patient lived and functioned independently prior to being originally hospitalized in January after being found unresponsive on her CPAP requiring intubation and treatment for pneumonia.  Since that time she has been admitted and intubated twice being treated for sepsis related to pneumonia and UTI.  Since January, she has continued to have this ongoing encephalopathy which waxes and wanes per her daughter, medical records note some concern for anoxic injury.     Patient was in general state of health today with her daughter was visiting, when she yelled out, looked to her right and started generalized jerking for around 2 minutes.  This was not witnessed by staff however patient remained altered from her baseline mental status and non-verbal.  No recent known fever or other complaints.  Her daughter states she is intermittently able to communicate and speak in sentences and follow commands at baseline; [facility] notes she is at baseline oriented to self, intermittently follows commands, and moves all extremities.  Following event she was taken for head CT which was negative, transferred to [facility] ICU, started on Keppra 500 mg  BID and EEG performed.  Results of EEG unknown.  She was transferred to [facility] for continuous EEG in concern for status epilepticus as patient has been unable to return to her baseline mental status.  PCCM to  admit.  She has had no witnessed seizure activity since admission to the hospital and continuous EEG monitoring showed no evidence of seizure activity.  She has spontaneous eye opening but is not at all interactive.  I have spoken with her daughter who is at the bedside this morning and she tells me that this is not her baseline state and that she would normally give verbal responses to my questions.    PAST MEDICAL HISTORY :  She  has no past medical history on file.  PAST SURGICAL HISTORY: She  has no past surgical history on file.  Not on File  No current facility-administered medications on file prior to encounter.    No current outpatient medications on file prior to encounter.    FAMILY HISTORY:  Her has no family status information on file.    SOCIAL HISTORY: Unable  REVIEW OF SYSTEMS:   Unable  SUBJECTIVE:   VITAL SIGNS: BP (!) 162/105   Pulse 70   Temp 98.8 F (37.1 C) (Axillary)   Resp 17   Ht  (1.676 m)   Wt 230 lb 6.1 oz (104.5 kg)   SpO2 100%   BMI 37.18 kg/m   HEMODYNAMICS:    VENTILATOR SETTINGS:    INTAKE / OUTPUT: I/O last 3 completed shifts: In: 1068.8 [I.V.:768.8; IV Piggyback:300] Out: 500 [Urine:500]  PHYSICAL EXAMINATION: General: This is an obese elderly female in no acute distress.    HEENT: MM pink/moist, pupils 4 reactive, anicteric, no JVD Neuro: She is sitting up in bed with her eyes open and occasionally seems to fix on objects but  does not interact.  There is no response to instructions.  Pupils are equal, and she withdraws all fours from pain     CV S1 and S2 are regular without murmur rub or gallop .   PULM: Respirations are unlabored, there is symmetric air movement, there are no wheezes.     GI: The abdomen is obese and soft, nontender without any masses organomegaly.  A PEG tube is in place.      Extremities: warm/dry, trace BLE edema  Skin: no rashes   LABS:  BMET Recent Labs  Lab 09/02/17 2226  NA 138  K  3.8  CL 102  CO2 26  BUN 14  CREATININE 0.71  GLUCOSE 173*    Electrolytes Recent Labs  Lab 09/02/17 2226  CALCIUM 9.7  MG 1.7  PHOS 4.0    CBC Recent Labs  Lab 09/02/17 2226  WBC 9.8  HGB 10.6*  HCT 33.8*  PLT 390    Coag's Recent Labs  Lab 09/02/17 2226  INR 1.01    Sepsis Markers No results for input(s): LATICACIDVEN, PROCALCITON, O2SATVEN in the last 168 hours.  ABG Recent Labs  Lab 09/03/17 1401  PHART 7.538*  PCO2ART 28.9*  PO2ART 95.0    Liver Enzymes Recent Labs  Lab 09/02/17 2226  AST 15  ALT 12*  ALKPHOS 94  BILITOT 0.7  ALBUMIN 3.1*    Cardiac Enzymes No results for input(s): TROPONINI, PROBNP in the last 168 hours.  Glucose Recent Labs  Lab 09/03/17 0823 09/03/17 1222 09/03/17 1542 09/03/17 2002 09/04/17 0016 09/04/17 0808  GLUCAP 157* 152* 152* 114* 131* 163*    Imaging No results found.   STUDIES:  4/25 CTH (at [facility])>> non acute  CULTURES: 4/25 BC x 2 >>  ANTIBIOTICS: none  SIGNIFICANT EVENTS: 4/25 Admitted  LINES/TUBES: PIV x 2   DISCUSSION: 88 yoF with chronic encephalopathy sent from [facility] after witnessed seizure episode.  No prior seizure hx.  CTH negative.  Patient has not returned to her prior baseline with concern for SCSE and therefore transferred to [facility] for LTM.   ASSESSMENT / PLAN:  PULMONARY A: At risk for respiratory insufficiency given acute on chronic encephalopathy and concern for SCSE - currently protecting her airway, minimal O2 requirements. P:   Monitor  Supplemental O2 prn for sats > 94 Duonebs q 6 prn CXR for baseline Continue singular daily   CARDIOVASCULAR A:  Hx systolic HF, HLD - currently hemodynamically stable P:  Tele monitor Goal MAP > 65 Resume carvedilol and  lipitor in am  RENAL A:   No known acute issues P:   Monitor for urinary retention Assess BMET, mag, and phos Trend daily wts/ urinary output Replace electrolytes as  indicated  GASTROINTESTINAL A:   Dysphagia s/p Gtube P:   NPO for now, hold TFs and q 4 free water  SUP not indicated Assess LFTs  HEMATOLOGIC A:   No known acute process - afebrile P:  Cbc now and trend Heparin SQ and SCD for VTE  INFECTIOUS A:   No acute known issues  Sacral/ R buttock decub Hx recurrent PNA/ UTI - ? Ongoing aspiration P:   Assess CBC, UA, CXR WOC consult for sacral decub Assess blood cultures for baseline Trend WBC/ fever curve  ENDOCRINE A:   DM  P:   CBG q 4 with SSI  NEUROLOGIC A:   Altered mental status following an episode of jerking at an outside hospital.  Mental status has not returned  to baseline.   Continuous EEG here did not show any evidence of seizure activity.  By report the CT scan of the head done at [facility] was normal.  A search for metabolic provocations for altered mental status has shown no significant abnormalities other than the fact that she has a significant leukocyte esterase in her urine.  Urine culture has been ordered.  Greater concern, she seems to have an a aphasia during my examination this morning.  An MRI is pending for structural insult missed on the initial CT scan  Penny Pia, MD Pgr: [phone] or if no answer [phone] 09/04/2017, 11:11 AM

## 2017-09-04 NOTE — Progress Notes (Addendum)
Patient resting quietly. RN concerned about minimal urine output, patient has voided twice this shift but it was on the bed pad so unable to obtain an amount. Bladder scan performed, very difficult to get an accurate reading. Attempted with 2 different scanners with a result of 10 ml. Unable to get the ordered urine specimen for analysis.   1725- Bladder scanner inaccurate, may be due to the size of the patients abdomen (very obese). Patient saturated bed pad and sheet as well as 100 ml in the suction canister. Urine specimen obtained and sent.

## 2017-09-04 NOTE — Progress Notes (Signed)
Reason for consult: Seizure  Subjective: Patient lethargic, states do no touch me to persistent sternal rub, not following any  commands.    ROS: Unable to obtain due to poor mental status  Examination  Vital signs in last 24 hours: Temp:  [98.5 F (36.9 C)-99.4 F (37.4 C)] 98.8 F (37.1 C) (04/27 0800) Pulse Rate:  [62-92] 70 (04/27 1030) Resp:  [12-24] 17 (04/27 1030) BP: (100-189)/(64-149) 162/105 (04/27 1030) SpO2:  [94 %-100 %] 100 % (04/27 1030) Weight:  [104.5 kg (230 lb 6.1 oz)] 104.5 kg (230 lb 6.1 oz) (04/27 0522)  General: Does not  Appear in distress CVS: pulse-normal rate and rhythm RS: breathing comfortably Extremities: normal   Neuro: Mental status: Patient lethargic, arouses to sternal rub. Does not follow commands, does not answer any questions. Mumbles"did not touch me" Cranial nerves: Pupils equal and reactive bilaterally, no obvious facial symmetry Motor exam: withdraws to pain in both upper extremities. 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 Reflexes: brisk in both upper extremities, unable to elicit patellar reflexes Gait unable to test  Basic Metabolic Panel: Recent Labs  Lab 09/02/17 2226  NA 138  K 3.8  CL 102  CO2 26  GLUCOSE 173*  BUN 14  CREATININE 0.71  CALCIUM 9.7  MG 1.7  PHOS 4.0    CBC: Recent Labs  Lab 09/02/17 2226  WBC 9.8  NEUTROABS 4.8  HGB 10.6*  HCT 33.8*  MCV 84.7  PLT 390     Coagulation Studies: Recent Labs    09/02/17 12-Sep-2224  LABPROT 13.2  INR 1.01    Imaging Reviewed:     ASSESSMENT AND PLAN  67 year old female with suspected anoxic injury after being found unresponsive on her home CPAP in Jan 2019 machine subsequently discharged to nursing home with poor baseline of being bedbound and speaking few sentences and PEG tube for feeds. Her daughter describes episode where she turned her head to the left side and this had jerking of both upper extremities for about 2 minutes. Since then she has  been more lethargic than normal and brought to [facility].  She was treated for seizures and is on 1000 milligrams twice a day of Keppra. Long-term EEG showed generalized slowing but no seizure-like activity.   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

## 2017-09-05 LAB — CBC WITH DIFFERENTIAL/PLATELET
Basophils Absolute: 0.1 10*3/uL (ref 0.0–0.1)
Basophils Relative: 1 %
EOS ABS: 0.5 10*3/uL (ref 0.0–0.7)
Eosinophils Relative: 6 %
HCT: 33.1 % — ABNORMAL LOW (ref 36.0–46.0)
HEMOGLOBIN: 10.4 g/dL — AB (ref 12.0–15.0)
Lymphocytes Relative: 32 %
Lymphs Abs: 2.7 10*3/uL (ref 0.7–4.0)
MCH: 26.5 pg (ref 26.0–34.0)
MCHC: 31.4 g/dL (ref 30.0–36.0)
MCV: 84.2 fL (ref 78.0–100.0)
MONOS PCT: 8 %
Monocytes Absolute: 0.7 10*3/uL (ref 0.1–1.0)
NEUTROS PCT: 53 %
Neutro Abs: 4.5 10*3/uL (ref 1.7–7.7)
Platelets: 394 10*3/uL (ref 150–400)
RBC: 3.93 MIL/uL (ref 3.87–5.11)
RDW: 15.6 % — AB (ref 11.5–15.5)
WBC: 8.4 10*3/uL (ref 4.0–10.5)

## 2017-09-05 LAB — COMPREHENSIVE METABOLIC PANEL
ALBUMIN: 2.8 g/dL — AB (ref 3.5–5.0)
ALK PHOS: 93 U/L (ref 38–126)
ALT: 14 U/L (ref 14–54)
ANION GAP: 14 (ref 5–15)
AST: 16 U/L (ref 15–41)
BUN: 12 mg/dL (ref 6–20)
CO2: 23 mmol/L (ref 22–32)
Calcium: 9.5 mg/dL (ref 8.9–10.3)
Chloride: 104 mmol/L (ref 101–111)
Creatinine, Ser: 0.71 mg/dL (ref 0.44–1.00)
GFR calc non Af Amer: >60 mL/min (ref >60)
GLUCOSE: 159 mg/dL — AB (ref 65–99)
Potassium: 3.8 mmol/L (ref 3.5–5.1)
Sodium: 141 mmol/L (ref 135–145)
Total Bilirubin: 0.9 mg/dL (ref 0.3–1.2)
Total Protein: 6.7 g/dL (ref 6.5–8.1)

## 2017-09-05 LAB — GLUCOSE, CAPILLARY
GLUCOSE-CAPILLARY: 120 mg/dL — AB (ref 65–99)
GLUCOSE-CAPILLARY: 148 mg/dL — AB (ref 65–99)
GLUCOSE-CAPILLARY: 137 mg/dL — AB (ref 65–99)
GLUCOSE-CAPILLARY: 151 mg/dL — AB (ref 65–99)
Glucose-Capillary: 143 mg/dL — ABNORMAL HIGH (ref 65–99)

## 2017-09-05 LAB — MAGNESIUM: Magnesium: 1.5 mg/dL — ABNORMAL LOW (ref 1.7–2.4)

## 2017-09-05 MED ORDER — HYDRALAZINE HCL 20 MG/ML IJ SOLN
10.0000 mg | INTRAMUSCULAR | Status: DC | PRN
  Administered 2017-09-05 – 2017-09-14 (×6): 10 mg via INTRAVENOUS
  Filled 2017-09-05 (×4): qty 1

## 2017-09-05 MED ORDER — ASPIRIN 325 MG PO TABS
325.0000 mg | ORAL_TABLET | Freq: Every day | ORAL | Status: DC
  Administered 2017-09-05 – 2017-09-14 (×10): 325 mg
  Filled 2017-09-05 (×10): qty 1

## 2017-09-05 MED ORDER — JEVITY 1.2 CAL PO LIQD
1000.0000 mL | ORAL | Status: DC
  Administered 2017-09-05: 1000 mL
  Filled 2017-09-05: qty 1000

## 2017-09-05 MED ORDER — LISINOPRIL 2.5 MG PO TABS
2.5000 mg | ORAL_TABLET | Freq: Every day | ORAL | Status: DC
  Administered 2017-09-05 – 2017-09-14 (×10): 2.5 mg
  Filled 2017-09-05 (×10): qty 1

## 2017-09-05 MED ORDER — ATORVASTATIN CALCIUM 40 MG PO TABS
40.0000 mg | ORAL_TABLET | Freq: Every day | ORAL | Status: DC
  Administered 2017-09-05 – 2017-09-13 (×9): 40 mg via ORAL
  Filled 2017-09-05 (×9): qty 1

## 2017-09-05 NOTE — Progress Notes (Signed)
PULMONARY / CRITICAL CARE MEDICINE   Name: Christine Franklin MRN: [id number] DOB: 09/26/1950    ADMISSION DATE:  09/02/2017 CONSULTATION DATE:  09/02/17  REFERRING MD:  Dr. Julio Sicks at [facility]  CHIEF COMPLAINT:  Acute encephalopathy, concern for SLSE  HISTORY OF PRESENT ILLNESS:   HPI obtained from from patient's daughter and medical records as patient is encephalopathic and unable to provide history.   67 year old female with past medical significant for DM, CAD, CKD stage 3, HLD, obesity who presents from [facility] with concern for subclinical status epilepticus.   Patient lived and functioned independently prior to being originally hospitalized in January after being found unresponsive on her CPAP requiring intubation and treatment for pneumonia.  Since that time she has been admitted and intubated twice being treated for sepsis related to pneumonia and UTI.  Since January, she has continued to have this ongoing encephalopathy which waxes and wanes per her daughter, medical records note some concern for anoxic injury.     Patient was in general state of health today with her daughter was visiting, when she yelled out, looked to her right and started generalized jerking for around 2 minutes.  This was not witnessed by staff however patient remained altered from her baseline mental status and non-verbal.  No recent known fever or other complaints.  Her daughter states she is intermittently able to communicate and speak in sentences and follow commands at baseline; [facility] notes she is at baseline oriented to self, intermittently follows commands, and moves all extremities.  Following event she was taken for head CT which was negative, transferred to [facility] ICU, started on Keppra 500 mg  BID and EEG performed.  Results of EEG unknown.  She was transferred to [facility] for continuous EEG in concern for status epilepticus as patient has been unable to return to her baseline mental status.  PCCM to  admit.  She has had no witnessed seizure activity since admission to the hospital and continuous EEG monitoring showed no evidence of seizure activity.  An MRI obtained yesterday morning has shown a left parietal subcortical infarct.      PAST MEDICAL HISTORY :  She  has no past medical history on file.  PAST SURGICAL HISTORY: She  has no past surgical history on file.  Not on File  No current facility-administered medications on file prior to encounter.    No current outpatient medications on file prior to encounter.    FAMILY HISTORY:  Her has no family status information on file.    SOCIAL HISTORY: Unable  REVIEW OF SYSTEMS:   Unable  SUBJECTIVE:   VITAL SIGNS: BP (!) 163/110   Pulse 75   Temp 98.8 F (37.1 C) (Axillary)   Resp 15   Ht  (1.676 m)   Wt 230 lb 6.1 oz (104.5 kg)   SpO2 99%   BMI 37.18 kg/m   HEMODYNAMICS:    VENTILATOR SETTINGS:    INTAKE / OUTPUT: I/O last 3 completed shifts: In: 2868.8 [I.V.:2568.8; IV Piggyback:300] Out: 475 [Urine:475]  PHYSICAL EXAMINATION: General: Obese elderly female in no acute distress.     HEENT: MM pink/moist, pupils 4 reactive, anicteric, no JVD Neuro: She is attempting some speech for me in response to my questions today.  She moves both upper extremities left more vigorously than right.  She has a tremor in both upper extremities.  Pupils appear to be equal     CV S1 and S2 are regular without murmur or gallop .  PULM: Respirations are unlabored, there is symmetric air movement, no wheezes.  Respirations are unlabored, there is symmetric air movement, there are no wheezes.     GI: The abdomen is obese and soft with a PEG tube in place.        Extremities: warm/dry, trace BLE edema  Skin: no rashes   LABS:  BMET Recent Labs  Lab 09/02/17 2226 09/05/17 0649  NA 138 141  K 3.8 3.8  CL 102 104  CO2 26 23  BUN 14 12  CREATININE 0.71 0.71  GLUCOSE 173* 159*    Electrolytes Recent Labs   Lab 09/02/17 2226 09/05/17 0649  CALCIUM 9.7 9.5  MG 1.7 1.5*  PHOS 4.0  --     CBC Recent Labs  Lab 09/02/17 2226 09/05/17 0649  WBC 9.8 8.4  HGB 10.6* 10.4*  HCT 33.8* 33.1*  PLT 390 394    Coag's Recent Labs  Lab 09/02/17 2226  INR 1.01    Sepsis Markers No results for input(s): LATICACIDVEN, PROCALCITON, O2SATVEN in the last 168 hours.  ABG Recent Labs  Lab 09/03/17 1401  PHART 7.538*  PCO2ART 28.9*  PO2ART 95.0    Liver Enzymes Recent Labs  Lab 09/02/17 2226 09/05/17 0649  AST 15 16  ALT 12* 14  ALKPHOS 94 93  BILITOT 0.7 0.9  ALBUMIN 3.1* 2.8*    Cardiac Enzymes No results for input(s): TROPONINI, PROBNP in the last 168 hours.  Glucose Recent Labs  Lab 09/04/17 1557 09/04/17 1938 09/04/17 2334 09/05/17 0409 09/05/17 0815 09/05/17 1142  GLUCAP 132* 111* 118* 120* 148* 137*    Imaging No results found.   STUDIES:  4/25 CTH (at [facility])>> non acute  CULTURES: 4/25 BC x 2 >>  ANTIBIOTICS: none  SIGNIFICANT EVENTS: 4/25 Admitted  LINES/TUBES: PIV x 2   DISCUSSION: 39 yoF with chronic encephalopathy sent from [facility] after witnessed seizure episode.  No prior seizure hx.  CTH negative.  Patient has not returned to her prior baseline with concern for SCSE and therefore transferred to [facility] for LTM.   ASSESSMENT / PLAN:  PULMONARY A: At risk for respiratory insufficiency given acute on chronic encephalopathy and concern for SCSE - currently protecting her airway, minimal O2 requirements. P:   Monitor  Supplemental O2 prn for sats > 94 Duonebs q 6 prn CXR for baseline Continue singular daily   CARDIOVASCULAR A:  Hx systolic HF, HLD - currently hemodynamically stable P:  Tele monitor Goal MAP > 65 Resume carvedilol and  lipitor in am  RENAL A:   No known acute issues P:   Monitor for urinary retention Assess BMET, mag, and phos Trend daily wts/ urinary output Replace electrolytes as  indicated  GASTROINTESTINAL A:   Dysphagia s/p Gtube P:   NPO for now, hold TFs and q 4 free water  SUP not indicated Assess LFTs  HEMATOLOGIC A:   No known acute process - afebrile P:  Cbc now and trend Heparin SQ and SCD for VTE  INFECTIOUS A:   No acute known issues  Sacral/ R buttock decub Hx recurrent PNA/ UTI - ? Ongoing aspiration P:   Assess CBC, UA, CXR WOC consult for sacral decub Assess blood cultures for baseline Trend WBC/ fever curve  ENDOCRINE A:   DM  P:   CBG q 4 with SSI  NEUROLOGIC A:   Altered mental status following an episode of jerking at an outside hospital.  Mental status has not returned to baseline.  There is been no evidence of seizure activity during this admission however she has developed a left parietal subcortical infarct.  Clinically the infarct is more than 24 hours old so I am beginning to treat her hypertension bringing it slowly towards normal.  I have increased her dose of statin and introduced aspirin. Further studies pertinent to the etiology of her stroke per neurology.  Penny Pia, MD Pgr: [phone] or if no answer [phone] 09/05/2017, 2:12 PM

## 2017-09-05 NOTE — NC FL2 (Signed)
[place] MEDICAID FL2 LEVEL OF CARE SCREENING TOOL     IDENTIFICATION  Patient Name: Christine Franklin Birthdate: [birth year] Sex: female Admission Date (Current Location): 09/02/2017  [facility] and [place] Number:      Facility and Address:  The . [facility], [address] [place], [place] [address]      Provider Number: [id number]  Attending Physician Name and Address:  Marcelle Smiling, MD  Relative Name and Phone Number:       Current Level of Care: Hospital Recommended Level of Care: Skilled Nursing Facility Prior Approval Number:    Date Approved/Denied:   PASRR Number:    Discharge Plan: SNF    Current Diagnoses: Patient Active Problem List   Diagnosis Date Noted  . Generalized tonic-clonic seizure (HCC) 09/03/2017  . 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 Myoclonic jerking 09/03/2017  . Normocytic anemia 09/03/2017  . Acute encephalopathy 09/02/2017    Orientation RESPIRATION BLADDER Height & Weight     Self  Normal Incontinent, External catheter Weight: 230 lb 6.1 oz (104.5 kg) Height:   (167.6 cm)  BEHAVIORAL SYMPTOMS/MOOD NEUROLOGICAL BOWEL NUTRITION STATUS      Incontinent Feeding tube(PEG - feeding supplement (JEVITY 1.2 CAL) liquid 1,000 mL )  AMBULATORY STATUS COMMUNICATION OF NEEDS Skin   Extensive Assist Verbally Normal                       Personal Care Assistance Level of Assistance  Bathing, Feeding, Dressing Bathing Assistance: Maximum assistance Feeding assistance: Maximum assistance Dressing Assistance: Maximum assistance     Functional Limitations Info  Sight, Hearing, Speech Sight Info: Adequate Hearing Info: Adequate Speech Info: Impaired    SPECIAL CARE FACTORS FREQUENCY  PT (By licensed PT), OT (By licensed OT)     PT Frequency: 3x week OT Frequency: 3x week            Contractures Contractures Info: Not present    Additional Factors Info  Code Status, Allergies, Isolation Precautions,  Insulin Sliding Scale Code Status Info: Full Code Allergies Info: No Known Allergies   Insulin Sliding Scale Info: Novolog Isolation Precautions Info: Contact     Current Medications (09/05/2017):  This is the current hospital active medication list Current Facility-Administered Medications  Medication Dose Route Frequency Provider Last Rate Last Dose  . 0.9 %  sodium chloride infusion   Intravenous Continuous Aroor, Sushanth R, MD 75 mL/hr at 09/05/17 1400    . aspirin tablet 325 mg  325 mg Per Tube Daily Lynnell Jude, MD      . atorvastatin (LIPITOR) tablet 40 mg  40 mg Oral q1800 Lynnell Jude, MD      . carvedilol (COREG) tablet 3.125 mg  3.125 mg Per Tube BID WC Selmer Dominion B, NP   3.125 mg at 09/05/17 0900  . chlorhexidine (PERIDEX) 0.12 % solution 15 mL  15 mL Mouth Rinse BID Marcelle Smiling, MD   15 mL at 09/05/17 0900  . Chlorhexidine Gluconate Cloth 2 % PADS 6 each  6 each Topical Q0600 Marcelle Smiling, MD   6 each at 09/04/17 1222  . feeding supplement (JEVITY 1.2 CAL) liquid 1,000 mL  1,000 mL Per Tube Continuous Lynnell Jude, MD 15 mL/hr at 09/05/17 1400    . heparin injection 5,000 Units  5,000 Units Subcutaneous Q8H Selmer Dominion B, NP   5,000 Units at 09/05/17 1305  . hydrALAZINE (APRESOLINE) injection 5 mg  5 mg Intravenous Q4H  PRN Lynnell Jude, MD   5 mg at 09/05/17 1305  . insulin aspart (novoLOG) injection 1-3 Units  1-3 Units Subcutaneous Q4H Selmer Dominion B, NP   1 Units at 09/05/17 1200  . ipratropium-albuterol (DUONEB) 0.5-2.5 (3) MG/3ML nebulizer solution 3 mL  3 mL Nebulization Q6H PRN Selmer Dominion B, NP      . levETIRAcetam (KEPPRA) IVPB 1000 mg/100 mL premix  1,000 mg Intravenous Q12H Selmer Dominion B, NP   Stopped at 09/05/17 1130  . lisinopril (PRINIVIL,ZESTRIL) tablet 2.5 mg  2.5 mg Per Tube Daily Lynnell Jude, MD      . LORazepam (ATIVAN) injection 2 mg  2 mg Intravenous Once PRN Selmer Dominion B, NP      . MEDLINE mouth rinse  15 mL Mouth  Rinse q12n4p Marcelle Smiling, MD   15 mL at 09/05/17 1115  . montelukast (SINGULAIR) tablet 10 mg  10 mg Per Tube QHS Selmer Dominion B, NP   10 mg at 09/04/17 2123  . mupirocin ointment (BACTROBAN) 2 % 1 application  1 application Nasal BID Marcelle Smiling, MD   1 application at 09/05/17 0900     Discharge Medications: Please see discharge summary for a list of discharge medications.  Relevant Imaging Results:  Relevant Lab Results:   Additional Information SSN [id number]   [place], [place] [phone]

## 2017-09-05 NOTE — Clinical Social Work Note (Signed)
Clinical Social Work Assessment  Patient Details  Name: Christine Franklin MRN: [id number] Date of Birth: [birth year]  Date of referral:  09/05/17               Reason for consult:  Facility Placement                Permission sought to share information with:  Family Supports Permission granted to share information::     Name::     Dynita Lu Duffel  Agency::  [facility] [place], [place]  Relationship::  Daughter  Contact Information:  [phone]  Housing/Transportation Living arrangements for the past 2 months:  Skilled Nursing Facility Source of Information:  Adult Children Patient Interpreter Needed:  None Criminal Activity/Legal Involvement Pertinent to Current Situation/Hospitalization:  No - Comment as needed Significant Relationships:  Other Family Members, Adult Children Lives with:  Facility Resident Do you feel safe going back to the place where you live?  Yes Need for family participation in patient care:  Yes (Comment)  Care giving concerns:  Clinical Social Worker spoke with patient daughter over the phone who states that patient was scheduled to discharge from [facility] to a [place] facility and is hopeful that this hospitalization will not be a barrier for this transition.   Social Worker assessment / plan:  Visual merchandiser spoke with patient daughter over the phone to offer support and discuss patient needs at discharge.  Patient daughter states that patient was residing at [facility] prior to hospitalization and had plans to transition to [facility] in [place], [place].  Patient daughter has been in contact with facility who states that patient bed continues to remain available and updated paperwork / insurance authorization will need to be obtained prior to admission.  CSW to contact facility and confirm patient discharge plans once medically ready.  CSW informed patient daughter about the possible out of pocket expense for transportation - patient  daughter understands and will do what is necessary at time of discharge.  CSW remains available for support and to facilitate patient discharge needs once medically stable.  Employment status:  Disabled (Comment on whether or not currently receiving Disability) Insurance information:  Managed Medicare PT Recommendations:  Skilled Nursing Facility Information / Referral to community resources:  Skilled Nursing Facility  Patient/Family's Response to care:  Patient daughter verbalized understanding of CSW role and appreciation for support and involvement.  Patient daughter agreeable with placement at [place], [place] facility as that is where patient and family are from.    Patient/Family's Understanding of and Emotional Response to Diagnosis, Current Treatment, and Prognosis:  Patient daughter remains realistic about discharge process and patient limitations requiring continued SNF placement.  Patient daughter is hopeful that patient will continue to improve and be discharged to an area closer to her home.  Emotional Assessment Appearance:  Appears older than stated age Attitude/Demeanor/Rapport:  Unable to Assess Affect (typically observed):  Unable to Assess Orientation:  Oriented to Self Alcohol / Substance use:  Not Applicable Psych involvement (Current and /or in the community):  No (Comment)  Discharge Needs  Concerns to be addressed:  Discharge Planning Concerns Readmission within the last 30 days:  No Current discharge risk:  Cognitively Impaired, Physical Impairment Barriers to Discharge:  Continued Medical Work up  [employer], [place] [phone]

## 2017-09-05 NOTE — Progress Notes (Signed)
Reason for consult: Seizures  Subjective: Lethargic, lying in bed, No seizures    ROS: Unable to obtain due to poor mental status  Examination  Vital signs in last 24 hours: Temp:  [98.8 F (37.1 C)-99.9 F (37.7 C)] 99.1 F (37.3 C) (04/28 2000) Pulse Rate:  [72-116] 116 (04/28 2306) Resp:  [13-27] 17 (04/28 2306) BP: (129-180)/(78-134) 156/84 (04/28 2306) SpO2:  [97 %-100 %] 98 % (04/28 2306) Weight:  [104.5 kg (230 lb 6.1 oz)] 104.5 kg (230 lb 6.1 oz) (04/28 0600)  General: Not in distress, cooperative CVS: pulse-normal rate and rhythm RS: breathing comfortably Extremities: normal   Neuro: MS: Lethargic, does not follow commands, mostly non verbal  CN: pupils equal and reactive,  EOMI, face symmetric, tongue midline Motor: 4/5 strength in upper extremities, spastic paresis of both legs Coordination: unable to test Gait: unable to walk  Basic Metabolic Panel: Recent Labs  Lab 09/02/17 2226 09/05/17 0649  NA 138 141  K 3.8 3.8  CL 102 104  CO2 26 23  GLUCOSE 173* 159*  BUN 14 12  CREATININE 0.71 0.71  CALCIUM 9.7 9.5  MG 1.7 1.5*  PHOS 4.0  --     CBC: Recent Labs  Lab 09/02/17 2226 09/05/17 0649  WBC 9.8 8.4  NEUTROABS 4.8 4.5  HGB 10.6* 10.4*  HCT 33.8* 33.1*  MCV 84.7 84.2  PLT 390 394     Coagulation Studies: No results for input(s): LABPROT, INR in the last 72 hours.  Imaging Reviewed: MRI Brain     ASSESSMENT AND PLAN  Seizures Continue Keppra Reduced dose to  BID   Acute left parietal infarction   Recommend #MRA Head and neck  #Transthoracic Echo  # Start patient on ASA  daily #Start or continue Atorvastatin 80 mg/other high intensity statin # BP goal:normotension> 24 hrs from stroke  # HBAIC and Lipid profile # Telemetry monitoring # Frequent neuro checks # stroke swallow screen     Sushanth Aroor [facility] Pager Number [phone] For questions after 7pm please refer to AMION to reach the  Neurologist on call

## 2017-09-06 ENCOUNTER — Inpatient Hospital Stay (HOSPITAL_COMMUNITY): Payer: Medicare (Managed Care)

## 2017-09-06 DIAGNOSIS — G9340 Encephalopathy, unspecified: Secondary | ICD-10-CM

## 2017-09-06 DIAGNOSIS — I6330 Cerebral infarction due to thrombosis of unspecified cerebral artery: Secondary | ICD-10-CM

## 2017-09-06 LAB — HEMOGLOBIN A1C
HEMOGLOBIN A1C: 7.8 % — AB (ref 4.8–5.6)
MEAN PLASMA GLUCOSE: 177.16 mg/dL

## 2017-09-06 LAB — GLUCOSE, CAPILLARY
GLUCOSE-CAPILLARY: 152 mg/dL — AB (ref 65–99)
GLUCOSE-CAPILLARY: 177 mg/dL — AB (ref 65–99)
GLUCOSE-CAPILLARY: 181 mg/dL — AB (ref 65–99)
GLUCOSE-CAPILLARY: 181 mg/dL — AB (ref 65–99)
Glucose-Capillary: 199 mg/dL — ABNORMAL HIGH (ref 65–99)
Glucose-Capillary: 185 mg/dL — ABNORMAL HIGH (ref 65–99)
Glucose-Capillary: 183 mg/dL — ABNORMAL HIGH (ref 65–99)

## 2017-09-06 LAB — LIPID PANEL
CHOLESTEROL: 96 mg/dL (ref 0–200)
HDL: 36 mg/dL — ABNORMAL LOW (ref >40)
LDL CALC: 42 mg/dL (ref 0–99)
Total CHOL/HDL Ratio: 2.7 RATIO
Triglycerides: 92 mg/dL (ref <150)
VLDL: 18 mg/dL (ref 0–40)

## 2017-09-06 MED ORDER — JEVITY 1.2 CAL PO LIQD
1000.0000 mL | ORAL | Status: DC
  Administered 2017-09-06 – 2017-09-14 (×9): 1000 mL
  Filled 2017-09-06 (×14): qty 1000

## 2017-09-06 MED ORDER — MAGNESIUM SULFATE 2 GM/50ML IV SOLN
2.0000 g | Freq: Once | INTRAVENOUS | Status: AC
  Administered 2017-09-06: 2 g via INTRAVENOUS
  Filled 2017-09-06: qty 50

## 2017-09-06 MED ORDER — CARVEDILOL 6.25 MG PO TABS
6.2500 mg | ORAL_TABLET | Freq: Two times a day (BID) | ORAL | Status: DC
  Administered 2017-09-06 – 2017-09-14 (×16): 6.25 mg
  Filled 2017-09-06: qty 2
  Filled 2017-09-06 (×15): qty 1

## 2017-09-06 MED ORDER — PRO-STAT SUGAR FREE PO LIQD
30.0000 mL | Freq: Two times a day (BID) | ORAL | Status: DC
  Administered 2017-09-06 – 2017-09-14 (×16): 30 mL
  Filled 2017-09-06 (×16): qty 30

## 2017-09-06 MED ORDER — SODIUM CHLORIDE 0.9 % IV SOLN
INTRAVENOUS | Status: DC
  Administered 2017-09-07 – 2017-09-09 (×4): via INTRAVENOUS

## 2017-09-06 MED ORDER — PRO-STAT SUGAR FREE PO LIQD
30.0000 mL | Freq: Two times a day (BID) | ORAL | Status: DC
  Administered 2017-09-06: 30 mL via ORAL
  Filled 2017-09-06: qty 30

## 2017-09-06 NOTE — Clinical Social Work Note (Signed)
Clinical Social Worker continuing to follow patient and family for support and discharge planning needs.  CSW spoke with patient daughter at bedside, who states that patient was a resident with [facility] prior to hospitalization - RNCM notified.  Patient daughter feels that patient may need to return to [facility] prior to [facility] in [place], [place].  CSW to further explore patient options as patient becomes more medically stable.  Patient daughter aware and agreeable with most appropriate avenue at discharge.  CSW remains available for support and to facilitate patient discharge needs once medically stable.  Macario Golds, [place] [phone]

## 2017-09-06 NOTE — Progress Notes (Signed)
VASCULAR LAB PRELIMINARY  PRELIMINARY  PRELIMINARY  PRELIMINARY  Right carotid duplex completed.    Preliminary report:  1-39% right ICA plaquing.  Right Vertebral artery flow is antegrade. Limited study secondary to patient's inability to follow instructions and keep head turned.  Left vertebral artery flow is antegrade.  Not able to visualize artery past the left  proximal common carotid, secondary to positioning.  Sherryn Pollino, RVT 09/06/2017, 2:35 PM

## 2017-09-06 NOTE — Progress Notes (Signed)
Initial Nutrition Assessment  DOCUMENTATION CODES:   Obesity unspecified  INTERVENTION:   Tube feeding via PEG: - Continue Jevity 1.2 @ 50 ml/hr (1200 ml/day) - 30 ml Pro-stat BID - free water flushes per MD  Tube feeding regimen provides 1640 kcal, 97 grams of protein, and 972 ml of H2O.   NUTRITION DIAGNOSIS:   Inadequate oral intake related to inability to eat as evidenced by NPO status.  GOAL:   Patient will meet greater than or equal to 90% of their needs  MONITOR:   Labs, Skin, TF tolerance, Weight trends  REASON FOR ASSESSMENT:   Consult Enteral/tube feeding initiation and management  ASSESSMENT:   67 year old female who transferred from [facility] after a witnessed seizure-like event. Pt hag PEG tube placed in January 2019 s/p extubation and failure of swallow evaluation. Pt has been admitted and intubated twice since this time. PMH significant for diabetes mellitus, CAD, CKD stage 3, and hyperlipidemia.  09/04/17 - MR shows left parietal subcortical infarct  Discussed pt during ICU interdisciplinary rounds and with RN.  Spoke with pt's daughter at bedside. Per pt's daughter, pt's tube feeding regimen at [facility] is Jevity 1.2 @ 50 ml/hr via PEG as pt did not tolerate Glucerna tube feeds and had diarrhea x 2 months while on Glucerna.  Jevity 1.2 infusing via PEG @ 50 ml/hr at time of RD visit. Noted 30 ml Pro-stat BID ordered.  Per pt's daughter, pt has not recently lost weight. Pt's weight prior to going to [facility] was 234 lbs. Pt now weighs 230 lbs. Pt has been receiving OT while at [facility].  Medications reviewed and include: sliding scale Novolog, heparin, Keppra, 2 grams magnesium sulfate once  Labs reviewed: magnesium 1.5 (L), hemoglobin 10.4 (L), HCT 33.1 (L) CBG's: 185, 199, 152, 143, 151 x 24 hours  NUTRITION - FOCUSED PHYSICAL EXAM:    Most Recent Value  Orbital Region  No depletion  Upper Arm Region  Mild depletion  Thoracic and Lumbar Region  No  depletion  Buccal Region  No depletion  Temple Region  No depletion  Clavicle Bone Region  Mild depletion  Clavicle and Acromion Bone Region  Mild depletion  Scapular Bone Region  Unable to assess  Dorsal Hand  No depletion  Patellar Region  No depletion  Anterior Thigh Region  No depletion  Posterior Calf Region  Mild depletion  Edema (RD Assessment)  None  Hair  Reviewed  Eyes  Reviewed  Mouth  Reviewed  Skin  Reviewed  Nails  Reviewed       Diet Order:  Diet NPO time specified Seizure precautions Aspiration precautions  EDUCATION NEEDS:   No education needs have been identified at this time  Skin:  Skin Assessment: Reviewed RN Assessment(moisture associated skin damage to groin and perineum)  Last BM:  09/05/17 - small type 6  Height:   Ht Readings from Last 1 Encounters:  09/06/17  (1.676 m)    Weight:   Wt Readings from Last 1 Encounters:  09/06/17 230 lb 13.2 oz (104.7 kg)    Ideal Body Weight:  59.1 kg  BMI:  Body mass index is 37.26 kg/m.  Estimated Nutritional Needs:   Kcal:  1600-1800 kcal/day  Protein:  85-100 grams/day  Fluid:  1.6-1.8 L/day    Earma Reading, MS, RD, LDN Pager: [phone] Weekend/After Hours: [phone]

## 2017-09-06 NOTE — Progress Notes (Addendum)
PULMONARY / CRITICAL CARE MEDICINE   Name: Christine Franklin MRN: [id number] DOB: 31-Jan-1951    ADMISSION DATE:  09/02/2017 CONSULTATION DATE:  09/02/17  REFERRING MD:  Dr. Julio Sicks at [facility]  CHIEF COMPLAINT:  Acute encephalopathy, concern for SLSE  BRIEF SUMMARY:   67 year old female with past medical significant for DM, CAD, CKD stage 3, HLD, obesity who presents from [facility] with concern for subclinical status epilepticus.   The patient functioned independently prior to being originally hospitalized in January after being found unresponsive on her CPAP requiring intubation and treatment for pneumonia.  Since that time she has been admitted and intubated twice being treated for sepsis related to pneumonia and UTI.  Since January, she has continued to have this ongoing encephalopathy which waxes and wanes per her daughter, medical records note some concern for anoxic injury.   She was working with PT/OT at [facility] and was nearing discharge.    The patient was in her baseline state of health 4/25, her daughter was visiting, when the patient yelled out, looked to her right and started generalized jerking for around 2 minutes.  This was not witnessed by staff however patient remained altered from her baseline mental status and non-verbal.  No recent known fever or other complaints.  Her daughter states she is intermittently able to communicate and speak in sentences and follow commands at baseline; [facility] notes she is at baseline oriented to self, intermittently follows commands, and moves all extremities.  Following event she was taken for head CT which was negative, transferred to [facility] ICU, started on Keppra 500 mg  BID and EEG performed.  Results of EEG unknown.  She was transferred to [facility] for continuous EEG in concern for status epilepticus as patient has been unable to return to her baseline mental status.  PCCM to admit.  She has had no witnessed seizure activity since admission to the  hospital and continuous EEG monitoring showed no evidence of seizure activity.  An MRI obtained yesterday morning has shown a left parietal subcortical infarct.      SUBJECTIVE:  RN reports no acute events - no seizure activity, vitals stable.  TF remains at 15 ml/hr. Afebrile.  No new labs 4/29.    VITAL SIGNS: BP (!) 165/89   Pulse 100   Temp 98.7 F (37.1 C) (Oral)   Resp 18   Ht  (1.676 m)   Wt 230 lb 13.2 oz (104.7 kg)   SpO2 99%   BMI 37.26 kg/m   HEMODYNAMICS:    VENTILATOR SETTINGS:    INTAKE / OUTPUT: I/O last 3 completed shifts: In: 3540 [I.V.:2925; NG/GT:315; IV Piggyback:300] Out: 570 [Urine:570]  PHYSICAL EXAMINATION: General:  Chronically ill appearing female in NAD, daughter at bedside HEENT: MM pink/moist Neuro: lying in bed with eyes closed, opens eyes to voice, speaks intermittently and is clear, does have difficulty following commands, localizes on right CV: s1s2 rrr, no m/r/g PULM: even/non-labored, lungs bilaterally clear  WU:JWJX, non-tender, bsx4 active, PEG site clean / no erythema or drainage Extremities: warm/dry, trace edema in hands, ? Arthritic changes, BLE heels with protective dressings in place  Skin: no rashes, sacral ulcer dressing intact per RN  LABS:  BMET Recent Labs  Lab 09/02/17 2226 09/05/17 0649  NA 138 141  K 3.8 3.8  CL 102 104  CO2 26 23  BUN 14 12  CREATININE 0.71 0.71  GLUCOSE 173* 159*    Electrolytes Recent Labs  Lab 09/02/17 2226 09/05/17 9147  CALCIUM 9.7 9.5  MG 1.7 1.5*  PHOS 4.0  --     CBC Recent Labs  Lab 09/02/17 2226 09/05/17 0649  WBC 9.8 8.4  HGB 10.6* 10.4*  HCT 33.8* 33.1*  PLT 390 394    Coag's Recent Labs  Lab 09/02/17 2226  INR 1.01    Sepsis Markers No results for input(s): LATICACIDVEN, PROCALCITON, O2SATVEN in the last 168 hours.  ABG Recent Labs  Lab 09/03/17 1401  PHART 7.538*  PCO2ART 28.9*  PO2ART 95.0    Liver Enzymes Recent Labs  Lab  09/02/17 2226 09/05/17 0649  AST 15 16  ALT 12* 14  ALKPHOS 94 93  BILITOT 0.7 0.9  ALBUMIN 3.1* 2.8*    Cardiac Enzymes No results for input(s): TROPONINI, PROBNP in the last 168 hours.  Glucose Recent Labs  Lab 09/05/17 1142 09/05/17 1543 09/05/17 2015 09/06/17 0006 09/06/17 0402 09/06/17 0744  GLUCAP 137* 151* 143* 152* 199* 185*    Imaging No results found.   STUDIES:  4/25 CTH (at [facility]) >> non acute 4/27 MRI Brain >> acute left parietal subcortical white matter infarct, advanced chronic changes of atrophy/small vessel disease 4/29 ECHO >>  4/29 CT Head >>   CULTURES: BCx2 4/25 >> UC 4/27 >>   ANTIBIOTICS:   SIGNIFICANT EVENTS: 4/25 Admitted with concern for seizure  4/27 MRI consistent with left parietal stroke   LINES/TUBES: PIV x 2   DISCUSSION: 13 yoF with chronic encephalopathy sent from [facility] after witnessed seizure episode.  No prior seizure hx.  CTH negative.  Patient has not returned to her prior baseline with concern for subclinical seizures and therefore transferred to [facility] for work up.  Found to have left parietal CVA.    ASSESSMENT / PLAN:  PULMONARY A: At Risk Airway Compromise - in setting of left parietal CVA P:   Pulmonary hygiene - IS, mobilize  O2 as needed for sats > 92% Duoneb Q6 PRN  Follow intermittent CXR  Continue singulair    CARDIOVASCULAR A:  Hx systolic HF, HLD P:  Tele monitoring Transfer to medical tele   Normotension BP goal per Neurology Continue lipitor, lisinopril  Increase coreg to 6.25 mg PT BID   RENAL A:   Hypomagnesemia  P:   Trend BMP / urinary output Replace electrolytes as indicated, Mg 4/29 Avoid nephrotoxic agents, ensure adequate renal perfusion  GASTROINTESTINAL A:   Dysphagia s/p G-tube P:   TF per nutrition  NPO  Aspiration precautions   HEMATOLOGIC A:   No known acute process P:  Trend CBC  Heparin Sq for DVT prophylaxis   INFECTIOUS A:    Sacral/ R Buttock  Decub - present on admit Hx recurrent PNA/ UTI - ? Ongoing aspiration P:   Follow fever curve, WBC trend  WOC for sacral decubitus care  Follow cultures as above  Monitor off abx   ENDOCRINE A:   DM  P:   CBG with SSI   NEUROLOGIC A:   Rule Out Seizure  Altered Mental Status P: Neurology following  Continue Keppra 500 mg BID  Further studies / imaging per Neurology  ASA 325 QD   Global:  Transfer to medical telemetry.  To [facility] as of 4/30.   Canary Brim, NP-C [facility] Pgr: [phone] or if no answer [phone] 09/06/2017, 9:10 AM

## 2017-09-06 NOTE — Progress Notes (Signed)
Stroke Team Progress Note   Christine Franklin is an 67 y.o. female who was at her baseline level of health, functioning independetly in January 2019 when she was found unresponsive on her home CPAP machine and admitted to the hospital. She had been intubated with that hospitalization, but when extubated she failed swallow evaluation and was determined to be cognitively impaired. 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 After the hospitalization she was discharged to a SNF and has had recurrent UTIs, sepsis and pneumonia requiring 2 intubations. Since January, she has continued to have an ongoing, waxing/waningencephalopathy.  On 09/02/17 at the SNF, the patient was at her baseline initially while her daughter was visiting, when she yelled out, looked to her right and then exhibited generalized jerking for around 2 minutes. The patient then remained altered from her baseline mental status and was non-verbal. Her daughter states she is intermittently able to communicate and speak in sentences and follow commands at baseline. She has not had any known fever or other complaints, recently. Per the SNF notes at baseline she is oriented to self, intermittently follows commands, and moves all extremities. Following the above event she was taken for a head CT which was negative, transferred to the [facility] ICU, started on Keppra 500 mg BID without a load and EEG performed. Results of EEG unknown, but per report there was some mention of "myoclonus". She was transferred to [facility] for continuous EEG with concern for possible status epilepticus   SUBJECTIVE Patient has not had any further witnessed seizure activity since arrival.she had overnight continuous video EEG recording which was abnormal due to atypical background and rhythm and arousal patents and generalized slow activity indicative of encephalopathy. There are no clearly identified epileptiform discharges  noted.MRI scan of the brain shows a tiny punctate left parietal subcortical diffusion positive lesion felt to be an acute infarct which may be the source of the patient's seizure. The patient daughter is at the bedside she feels the patient is able to speak little bit better but she is clearly not back to her baseline  OBJECTIVE Most recent Vital Signs: Temp: 100.1 F (37.8 C) (04/29 1200) Temp Source: Axillary (04/29 1200) BP: 149/79 (04/29 1400) Pulse Rate: 80 (04/29 1400) Respiratory Rate: 19 O2 Saturdation: 100%  CBG (last 3)  Recent Labs    09/06/17 0402 09/06/17 0744 09/06/17 1153  GLUCAP 199* 185* 177*    Diet: Diet NPO time specified Seizure precautions Aspiration precautions    Activity: bedrest  VTE Prophylaxis: SCDs  Studies: Results for orders placed or performed during the hospital encounter of 09/02/17 (from the past 24 hour(s))  Glucose, capillary     Status: Abnormal   Collection Time: 09/05/17  3:43 PM  Result Value Ref Range   Glucose-Capillary 151 (H) 65 - 99 mg/dL   Comment 1 Notify RN    Comment 2 Document in Chart   Glucose, capillary     Status: Abnormal   Collection Time: 09/05/17  8:15 PM  Result Value Ref Range   Glucose-Capillary 143 (H) 65 - 99 mg/dL  Glucose, capillary     Status: Abnormal   Collection Time: 09/06/17 12:06 AM  Result Value Ref Range   Glucose-Capillary 152 (H) 65 - 99 mg/dL  Glucose, capillary     Status: Abnormal   Collection Time: 09/06/17  4:02 AM  Result Value Ref Range   Glucose-Capillary 199 (H) 65 - 99 mg/dL  Glucose, capillary  Status: Abnormal   Collection Time: 09/06/17  7:44 AM  Result Value Ref Range   Glucose-Capillary 185 (H) 65 - 99 mg/dL  Lipid panel     Status: Abnormal   Collection Time: 09/06/17 10:55 AM  Result Value Ref Range   Cholesterol 96 0 - 200 mg/dL   Triglycerides 92 <045 mg/dL   HDL 36 (L) >40 mg/dL   Total CHOL/HDL Ratio 2.7 RATIO   VLDL 18 0 - 40 mg/dL   LDL Cholesterol 42 0 -  99 mg/dL  Hemoglobin J8J     Status: Abnormal   Collection Time: 09/06/17 10:55 AM  Result Value Ref Range   Hgb A1c MFr Bld 7.8 (H) 4.8 - 5.6 %   Mean Plasma Glucose 177.16 mg/dL  Glucose, capillary     Status: Abnormal   Collection Time: 09/06/17 11:53 AM  Result Value Ref Range   Glucose-Capillary 177 (H) 65 - 99 mg/dL     No results found. For long term EEG shows generalized slowing and encephalopathy findings but no definite seizure activity Physical Exam:    obese middle-aged African-American lady who is currently not in distress. . Afebrile. Head is nontraumatic. Neck is supple without bruit.    Cardiac exam no murmur or gallop. Lungs are clear to auscultation. Distal pulses are well felt. Neurological Exam : She is drowsy but does open eyes partially to stimulation. She does not follow commands consistently. She mumbles a few nonsensical words and occasional short sentences. She did not follow any commands for me. Pupils irregular but sluggishly reactive. Doll's eye movements are present. Fundi were not visualized. She blinks to threat bilaterally. Face is symmetric. Tongue is midline. Motor system exam she is able to withdraw left more than right upper extremities to painful stimuli  . She withdraws minimally both lower extremities to painful stimuli.Deep tendon reflexes are 2+ symmetric. Plantars are downgoing. Gait not tested. ASSESSMENT [name] is a 67 y.o. 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 Brain MRI shows punctate left parietal white matter infarct Neurological exam appears to be improving but not back to her baseline. Hospital day # 4  TREATMENT/PLAN Continue Keppra for seizures and recommend ongoing stroke workup. Check carotid ultrasound and transcranial Doppler studies as we cannot obtain CT angiogram due to history of  allergy to iodine dye. Check echocardiogram, lipid profile and hemoglobin A 1C. Start aspirin for stroke prevention. Discussion of the bedside with the patient and daughter as well as with critical care nurse practitioner and answered questions about her care. Greater than 50% time during this 35 minute visit was spent on counseling and coordination of care about her stroke and seizure and pending evaluation and treatment.  Delia Heady, MD [facility] Pager: [phone] 09/06/2017 2:29 PM

## 2017-09-07 ENCOUNTER — Other Ambulatory Visit (HOSPITAL_COMMUNITY): Payer: Medicare (Managed Care)

## 2017-09-07 LAB — BASIC METABOLIC PANEL
ANION GAP: 10 (ref 5–15)
BUN: 14 mg/dL (ref 6–20)
CALCIUM: 9 mg/dL (ref 8.9–10.3)
CO2: 19 mmol/L — ABNORMAL LOW (ref 22–32)
Chloride: 110 mmol/L (ref 101–111)
Creatinine, Ser: 0.61 mg/dL (ref 0.44–1.00)
GFR calc Af Amer: >60 mL/min (ref >60)
Glucose, Bld: 194 mg/dL — ABNORMAL HIGH (ref 65–99)
POTASSIUM: 3.7 mmol/L (ref 3.5–5.1)
SODIUM: 139 mmol/L (ref 135–145)

## 2017-09-07 LAB — URINE CULTURE: SPECIAL REQUESTS: NORMAL

## 2017-09-07 LAB — GLUCOSE, CAPILLARY
GLUCOSE-CAPILLARY: 174 mg/dL — AB (ref 65–99)
Glucose-Capillary: 207 mg/dL — ABNORMAL HIGH (ref 65–99)
Glucose-Capillary: 251 mg/dL — ABNORMAL HIGH (ref 65–99)
Glucose-Capillary: 171 mg/dL — ABNORMAL HIGH (ref 65–99)

## 2017-09-07 LAB — CBC
HCT: 32.5 % — ABNORMAL LOW (ref 36.0–46.0)
Hemoglobin: 10.3 g/dL — ABNORMAL LOW (ref 12.0–15.0)
MCH: 26.8 pg (ref 26.0–34.0)
MCHC: 31.7 g/dL (ref 30.0–36.0)
MCV: 84.4 fL (ref 78.0–100.0)
PLATELETS: 346 10*3/uL (ref 150–400)
RBC: 3.85 MIL/uL — AB (ref 3.87–5.11)
RDW: 16.3 % — AB (ref 11.5–15.5)
WBC: 8.7 10*3/uL (ref 4.0–10.5)

## 2017-09-07 LAB — CULTURE, BLOOD (ROUTINE X 2)
CULTURE: NO GROWTH
Culture: NO GROWTH
SPECIAL REQUESTS: ADEQUATE
Special Requests: ADEQUATE

## 2017-09-07 LAB — MAGNESIUM: MAGNESIUM: 1.7 mg/dL (ref 1.7–2.4)

## 2017-09-07 MED ORDER — INSULIN ASPART 100 UNIT/ML ~~LOC~~ SOLN
0.0000 [IU] | SUBCUTANEOUS | Status: DC
  Administered 2017-09-07 – 2017-09-08 (×7): 2 [IU] via SUBCUTANEOUS
  Administered 2017-09-09: 1 [IU] via SUBCUTANEOUS
  Administered 2017-09-09 (×2): 2 [IU] via SUBCUTANEOUS
  Administered 2017-09-09: 1 [IU] via SUBCUTANEOUS
  Administered 2017-09-09 – 2017-09-10 (×3): 2 [IU] via SUBCUTANEOUS
  Administered 2017-09-10: 1 [IU] via SUBCUTANEOUS
  Administered 2017-09-10 – 2017-09-11 (×5): 2 [IU] via SUBCUTANEOUS
  Administered 2017-09-11: 1 [IU] via SUBCUTANEOUS
  Administered 2017-09-11 (×2): 2 [IU] via SUBCUTANEOUS
  Administered 2017-09-12: 1 [IU] via SUBCUTANEOUS
  Administered 2017-09-12: 3 [IU] via SUBCUTANEOUS
  Administered 2017-09-12: 2 [IU] via SUBCUTANEOUS
  Administered 2017-09-12: 3 [IU] via SUBCUTANEOUS
  Administered 2017-09-12 – 2017-09-13 (×4): 2 [IU] via SUBCUTANEOUS
  Administered 2017-09-13: 1 [IU] via SUBCUTANEOUS
  Administered 2017-09-13 (×2): 2 [IU] via SUBCUTANEOUS
  Administered 2017-09-13: 1 [IU] via SUBCUTANEOUS
  Administered 2017-09-14 (×2): 3 [IU] via SUBCUTANEOUS
  Administered 2017-09-14 (×2): 2 [IU] via SUBCUTANEOUS

## 2017-09-07 MED ORDER — INSULIN ASPART 100 UNIT/ML ~~LOC~~ SOLN
3.0000 [IU] | SUBCUTANEOUS | Status: DC
  Administered 2017-09-07 – 2017-09-14 (×40): 3 [IU] via SUBCUTANEOUS

## 2017-09-07 NOTE — Progress Notes (Signed)
Stroke Team Progress Note     SUBJECTIVE Patient has not had any further witnessed seizure activity since arrival. The patient daughter is at the bedside she feels the patient is able to speak little bit better but she is clearly not back to her baseline yet . No new issues Carotid ultrasound shows no significant extracranial stenosis. Transcranial Doppler and echocardiogram are pending. Hemoglobin A1c was 7.2. LDL cholesterol was 42 mg percent. OBJECTIVE Most recent Vital Signs: Temp: 98.4 F (36.9 C) (04/30 1204) Temp Source: Oral (04/30 1204) BP: 141/66 (04/30 1204) Pulse Rate: 104 (04/30 1204) Respiratory Rate: 20 O2 Saturdation: 100%  CBG (last 3)  Recent Labs    09/07/17 0330 09/07/17 0747 09/07/17 1201  GLUCAP 207* 251* 171*    Diet:  Diet Order           Diet NPO time specified  Diet effective now            Activity: bedrest  VTE Prophylaxis: SCDs  Studies: Results for orders placed or performed during the hospital encounter of 09/02/17 (from the past 24 hour(s))  Glucose, capillary     Status: Abnormal   Collection Time: 09/06/17  4:00 PM  Result Value Ref Range   Glucose-Capillary 183 (H) 65 - 99 mg/dL   Comment 1 Notify RN    Comment 2 Document in Chart   Glucose, capillary     Status: Abnormal   Collection Time: 09/06/17  7:46 PM  Result Value Ref Range   Glucose-Capillary 181 (H) 65 - 99 mg/dL  Glucose, capillary     Status: Abnormal   Collection Time: 09/06/17 11:13 PM  Result Value Ref Range   Glucose-Capillary 181 (H) 65 - 99 mg/dL  CBC     Status: Abnormal   Collection Time: 09/07/17  2:24 AM  Result Value Ref Range   WBC 8.7 4.0 - 10.5 K/uL   RBC 3.85 (L) 3.87 - 5.11 MIL/uL   Hemoglobin 10.3 (L) 12.0 - 15.0 g/dL   HCT 16.1 (L) 09.6 - 04.5 %   MCV 84.4 78.0 - 100.0 fL   MCH 26.8 26.0 - 34.0 pg   MCHC 31.7 30.0 - 36.0 g/dL   RDW 40.9 (H) 81.1 - 91.4 %   Platelets 346 150 - 400 K/uL  Basic metabolic panel     Status: Abnormal   Collection Time: 09/07/17  2:24 AM  Result Value Ref Range   Sodium 139 135 - 145 mmol/L   Potassium 3.7 3.5 - 5.1 mmol/L   Chloride 110 101 - 111 mmol/L   CO2 19 (L) 22 - 32 mmol/L   Glucose, Bld 194 (H) 65 - 99 mg/dL   BUN 14 6 - 20 mg/dL   Creatinine, Ser 7.82 0.44 - 1.00 mg/dL   Calcium 9.0 8.9 - 95.6 mg/dL   GFR calc non Af Amer >60 >60 mL/min   GFR calc Af Amer >60 >60 mL/min   Anion gap 10 5 - 15  Magnesium     Status: None   Collection Time: 09/07/17  2:24 AM  Result Value Ref Range   Magnesium 1.7 1.7 - 2.4 mg/dL  Glucose, capillary     Status: Abnormal   Collection Time: 09/07/17  3:30 AM  Result Value Ref Range   Glucose-Capillary 207 (H) 65 - 99 mg/dL  Glucose, capillary     Status: Abnormal   Collection Time: 09/07/17  7:47 AM  Result Value Ref Range   Glucose-Capillary 251 (H) 65 -  99 mg/dL  Glucose, capillary     Status: Abnormal   Collection Time: 09/07/17 12:01 PM  Result Value Ref Range   Glucose-Capillary 171 (H) 65 - 99 mg/dL     No results found.  Long term EEG shows generalized slowing and encephalopathy findings but no definite seizure activity Physical Exam:    obese middle-aged African-American lady who is currently not in distress. . Afebrile. Head is nontraumatic. Neck is supple without bruit.    Cardiac exam no murmur or gallop. Lungs are clear to auscultation. Distal pulses are well felt. Neurological Exam : She is awake and interactive She does not follow commands consistently. She mumbles a few nonsensical words and occasional short sentences. She   Follows only midline   commands for me. Pupils irregular but sluggishly reactive. Doll's eye movements are present. Fundi were not visualized. She blinks to threat bilaterally. Face is symmetric. Tongue is midline. Motor system exam she is able to withdraw left more than right upper extremities to painful stimuli  . She withdraws minimally both lower extremities to painful stimuli.Deep tendon reflexes are  2+ symmetric. Plantars are downgoing. Gait not tested. ASSESSMENT [name] is a 67 y.o. 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 Brain MRI shows punctate left parietal white matter infarct Neurological exam appears to be improving but not back to her baseline. Hospital day # 5  TREATMENT/PLAN Continue Keppra for seizures and recommend ongoing stroke workup. Check echo and transcranial Doppler studies as we cannot obtain CT angiogram due to history of allergy to iodine dye. Continue aspirin for stroke prevention. Discussion  with the patient and daughter  At bedisde and answered questions about her care.    Delia Heady, MD [facility] Pager: [phone] 09/07/2017 1:53 PM

## 2017-09-07 NOTE — Evaluation (Signed)
Physical Therapy Evaluation Patient Details Name: Christine Franklin MRN: [id number] DOB: [birth year] Today's Date: 09/07/2017   History of Present Illness  Anjela Cassara comes to [facility] from [facility] on 4/25 after an event witnessed by patient's daughter, described as similar to typical tonic-cloinc seizure event. Event took place for 2 minutes, followed by sustained decrease in verbal responsiveness, which persists. The patient was in her "normal state of health" until January 2019, when she was found to be unresponsive while on her home CPAP.  She was hospitalized, requiring endotracheal intubation.  After liberation from mechanical ventilatory support, she failed her swallow evaluation, leading to PEG tube placement.  She was subsequently discharged to skilled nursing facilities and has been in a vicious cycle of recurrent UTIs, pneumonia and intubations.   Clinical Impression  Pt admitted with above diagnosis. Pt currently with functional limitations due to the deficits listed below (see "PT Problem List"). Upon entry, the patient is received semi recumbent in bed, no family/caregiver present. The pt is awake, mumbling quietly the same non-contextual phrase repeatedly to most questioning. Her verbal responses do vary 2 or 3 times, when she says she is from [place] to confirm Author's question, but she is unable to state her name when asked, and says 'yes' to most questions, even when asked to say 'no' several times. She is unabel to follow commands for simple functional UE movements. She demonstrates some trace toe wiggling upon command. BUE are hypertonic, very rigid, tremulous, somewhat warm, and edematous (R>L), unable to mobilize joints much without grimacing and pain. Pt will require Total assistance for all mobility and ADL in current state. Pt will benefit from skilled PT intervention to increase independence and safety with basic mobility in preparation for discharge to the venue listed below.        Follow Up Recommendations LTACH;Supervision/Assistance - 24 hour    Equipment Recommendations  None recommended by PT    Recommendations for Other Services       Precautions / Restrictions Precautions Precautions: Fall Precaution Comments: Seizure; moisture wounds on buttock       Mobility  Bed Mobility Overal bed mobility: (unable to follow commands for simple hand gestures, reaching out to touch PT or nose. trmors, adn rigiidity )                Transfers                    Ambulation/Gait                Stairs            Wheelchair Mobility    Modified Rankin (Stroke Patients Only)       Balance                                             Pertinent Vitals/Pain Pain Assessment: Faces Faces Pain Scale: Hurts even more Pain Location: grimacing with movement of hands, elbows, shoulders against rigidity.  Pain Descriptors / Indicators: Grimacing Pain Intervention(s): Limited activity within patient's tolerance;Monitored during session;Repositioned    Home Living Family/patient expects to be discharged to:: Other (Comment)(LTAC)                 Additional Comments: Has been at [facility]     Prior Function Level of Independence: Needs assistance  Comments: Unclear detail at this time, but patient has been critically ill, since January 2019, coming in from [place].      Hand Dominance        Extremity/Trunk Assessment   Upper Extremity Assessment Upper Extremity Assessment: (generally edematous and rigid, with tremors bilat, rigidity limits mobility of all UE joints by >75%. )            Communication   Communication: Expressive difficulties  Cognition Arousal/Alertness: Awake/alert Behavior During Therapy: Flat affect(minimally reactive, inappropriate resposne to questions, commands ) Overall Cognitive Status: Impaired/Different from baseline                                  General Comments: mumbles in response to verbal stimulus, perseverating, with repitiiong for the same phrase over and over, responds yes to everything, even when asked to say 'no'       General Comments      Exercises     Assessment/Plan    PT Assessment Patient needs continued PT services  PT Problem List Decreased cognition;Decreased knowledge of precautions;Decreased coordination;Decreased mobility;Decreased range of motion;Impaired tone;Pain;Decreased skin integrity       PT Treatment Interventions Neuromuscular re-education;Functional mobility training;Therapeutic activities;Patient/family education;Manual techniques;Cognitive remediation    PT Goals (Current goals can be found in the Care Plan section)  Acute Rehab PT Goals PT Goal Formulation: Patient unable to participate in goal setting    Frequency Min 3X/week   Barriers to discharge        Co-evaluation               AM-PAC PT "6 Clicks" Daily Activity  Outcome Measure Difficulty turning over in bed (including adjusting bedclothes, sheets and blankets)?: Unable Difficulty moving from lying on back to sitting on the side of the bed? : Unable Difficulty sitting down on and standing up from a chair with arms (e.g., wheelchair, bedside commode, etc,.)?: Unable Help needed moving to and from a bed to chair (including a wheelchair)?: Total Help needed walking in hospital room?: Total Help needed climbing 3-5 steps with a railing? : Total 6 Click Score: 6    End of Session   Activity Tolerance: Other (comment)(limited by cognitive deficits klimiting communication, as well as lglobal rigidity. ) Patient left: in bed(as received. ) Nurse Communication: Other (comment) PT Visit Diagnosis: Other symptoms and signs involving the nervous system (R29.898)    Time: 1610-9604 PT Time Calculation (min) (ACUTE ONLY): 10 min   Charges:   PT Evaluation $PT Eval High Complexity: 1 High     PT G Codes:         3:59 PM, 2017-09-15 Rosamaria Lints, PT, DPT Physical Therapist - [place] [phone] (Pager)  [phone] (Office)     Amire Leazer C Sep 15, 2017, 3:53 PM

## 2017-09-07 NOTE — Plan of Care (Signed)
progressing 

## 2017-09-07 NOTE — Progress Notes (Signed)
Inpatient Diabetes Program Recommendations  AACE/ADA: New Consensus Statement on Inpatient Glycemic Control (2015)  Target Ranges:  Prepandial:   less than 140 mg/dL      Peak postprandial:   less than 180 mg/dL (1-2 hours)      Critically ill patients:  140 - 180 mg/dL   Results for DAVE, MANNES (MRN [id number]) as of 09/07/2017 12:37  Ref. Range 09/06/2017 07:44 09/06/2017 11:53 09/06/2017 16:00 09/06/2017 19:46 09/06/2017 23:13 09/07/2017 03:30 09/07/2017 07:47  Glucose-Capillary Latest Ref Range: 65 - 99 mg/dL 409 (H)  Novolog 2 units 177 (H)  Novolog 2 units 183 (H)  Novolog 2 units 181 (H)  Novolog 2 units 181 (H)  Novolog 2 units 207 (H)  Novolog 3 units 251 (H)  Novolog 3 units  Results for EUGENE, ZEIDERS (MRN [id number]) as of 09/07/2017 12:37  Ref. Range 09/06/2017 10:55  Hemoglobin A1C Latest Ref Range: 4.8 - 5.6 % 7.8 (H)   Review of Glycemic Control  Diabetes history: DM2 Outpatient Diabetes medications: Levemir 30 units QHS Current orders for Inpatient glycemic control: Novolog 1-3 units Q4H  Inpatient Diabetes Program Recommendations: Insulin - Meal Coverage: Please consider ordering Novolog 3 units Q4H for tube feeding coverage. If tube feeding is stopped or held then Novolog tube feeding coverage should also be stopped or held.  Thanks, Orlando Penner, RN, MSN, CDE Diabetes Coordinator Inpatient Diabetes Program [phone] (Team Pager from 8am to 5pm)

## 2017-09-07 NOTE — Progress Notes (Signed)
PROGRESS NOTE    Christine Franklin  XBM:[id number] DOB: 03-06-1951 DOA: 09/02/2017 PCP: [name], MD   Brief Narrative:  Per previous PN 67 year old female with past medical significant for DM, CAD, CKD stage 3, HLD, obesity who presents from [facility] with concern for subclinical status epilepticus.   The patient functioned independently prior to being originally hospitalized in January after being found unresponsive on her CPAP requiring intubation and treatment for pneumonia.  Since that time she has been admitted and intubated twice being treated for sepsis related to pneumonia and UTI.  Since January, she has continued to have this ongoing encephalopathy which waxes and wanes per her daughter, medical records note some concern for anoxic injury.   She was working with PT/OT at [facility] and was nearing discharge.    The patient was in her baseline state of health 4/25, her daughter was visiting, when the patient yelled out, looked to her right and started generalized jerking for around 2 minutes.  This was not witnessed by staff however patient remained altered from her baseline mental status and non-verbal.  No recent known fever or other complaints.  Her daughter states she is intermittently able to communicate and speak in sentences and follow commands at baseline; [facility] notes she is at baseline oriented to self, intermittently follows commands, and moves all extremities.  Following event she was taken for head CT which was negative, transferred to [facility] ICU, started on Keppra 500 mg  BID and EEG performed.  Results of EEG unknown.  She was transferred to [facility] for continuous EEG in concern for status epilepticus as patient has been unable to return to her baseline mental status.  PCCM to admit.  She has had no witnessed seizure activity since admission to the hospital and continuous EEG monitoring showed no evidence of seizure activity.  An MRI obtained 4/27 morning has shown Christine Franklin left  parietal subcortical infarct.     She was transferred to the hospitalists service pm 4/30.  Neurology is following and her stroke workup is still in progress.   Assessment & Plan:   Principal Problem:   Acute encephalopathy Active Problems:   Generalized tonic-clonic seizure (HCC)   Postictal state (HCC)   Myoclonic jerking   Normocytic anemia   Cerebral thrombosis with cerebral infarction   Acute Stroke:  Continue asa, statin PT/OT/SLP Echo with bubble Transcranial doppler as unable to obtain CT angio Carotid dopplers limited due to patients ability to follow instrutions and positioning, but R carotid with "near normal" extracranial vessels.  Vertebrals with antegrade flow.  Normal flow in the R subclavian. A1c 7.8 Lipids LDL 42, HDL 36 Neurology c/s, appreciate recommendations  Seizures: Keppra, appreciate neurology Ativan prn  Chronic Encephalopathy: ?anoxic brain injury per chart review.  Delirium precautions  T2DM:  q4 SSI and 3 units q4 with tube feeds  S/p G tube:  Tube feeds per dietition Insulin as above  CAD: coreg, statin, asa  HTN: lisinopril, coreg  Sacral Decubitus Ulcer: POA, wound c/s  History of recurrent pneumonia and UTI: no si/sx of infection at this point.    DVT prophylaxis: heparin Code Status: full code Family Communication: daughter at bedside Disposition Plan: pending completion of workup   Consultants:   Neurology  PCCM  Procedures:  Carotid US Final Interpretation: Right Carotid: The extracranial vessels were near-normal with only minimal wall        thickening or plaque. Vertebrals: Bilateral vertebral arteries demonstrate antegrade flow. Subclavians: Normal flow hemodynamics were  seen in the right subclavian artery.  Antimicrobials:  Anti-infectives (From admission, onward)   None      Subjective: Prior to everything, her mom was talking and making Christine Franklin bit more sense.   Working with therapist.  Arms  seemed contracted. Now she's nto following commands, difficult to understand her (per daughter)  Pt says November when asked what month it is.  Doesn't answer other questions appropriately, repeats words.  She did say that her daughter was very smart, and this was clear.   Objective: Vitals:   09/07/17 0700 09/07/17 0713 09/07/17 1204 09/07/17 1700  BP: 130/79 (!) 142/73 (!) 141/66 121/64  Pulse: (!) 104 (!) 49 (!) 104 (!) 102  Resp: Temp:  98.9 F (37.2 C) 98.4 F (36.9 C) 99.1 F (37.3 C)  TempSrc:  Oral Oral Oral  SpO2: 97% 99% 100% 100%  Weight:      Height:        Intake/Output Summary (Last 24 hours) at 09/07/2017 1749 Last data filed at 09/07/2017 0700 Gross per 24 hour  Intake 1508.33 ml  Output 800 ml  Net 708.33 ml   Filed Weights   09/05/17 0600 09/06/17 0600 09/07/17 0500  Weight: 104.5 kg (230 lb 6.1 oz) 104.7 kg (230 lb 13.2 oz) 106.6 kg (235 lb 0.2 oz)    Examination:  General exam: Appears calm and comfortable  Respiratory system: Clear to auscultation. Respiratory effort normal. Cardiovascular system: S1 & S2 heard, RRR. No JVD, murmurs, rubs, gallops or clicks. No pedal edema. Gastrointestinal system: Abdomen is nondistended, soft and nontender. No organomegaly or masses felt. Normal bowel sounds heard.  Gtube. Central nervous system: Alert.  Disoriented.  Difficult to perform neuroexam as does not follow commands, but speech is difficult to comprehend.  Moving all extremities. Skin: No rashes, lesions or ulcers Psychiatry: Judgement and insight appear normal. Mood & affect appropriate.     Data Reviewed: I have personally reviewed following labs and imaging studies  CBC: Recent Labs  Lab 09/02/17 2226 09/05/17 0649 09/07/17 0224  WBC 9.8 8.4 8.7  NEUTROABS 4.8 4.5  --   HGB 10.6* 10.4* 10.3*  HCT 33.8* 33.1* 32.5*  MCV 84.7 84.2 84.4  PLT 390 394 346   Basic Metabolic Panel: Recent Labs  Lab 09/02/17 2226 09/05/17 0649  09/07/17 0224  NA 138 141 139  K 3.8 3.8 3.7  CL 102 104 110  CO2 26 23 19*  GLUCOSE 173* 159* 194*  BUN CREATININE 0.71 0.71 1.61  CALCIUM 9.7 9.5 9.0  MG 1.7 1.5* 1.7  PHOS 4.0  --   --    GFR: Estimated Creatinine Clearance: 86.5 mL/min (by C-G formula based on SCr of 0.61 mg/dL). Liver Function Tests: Recent Labs  Lab 09/02/17 2226 09/05/17 0649  AST 15 16  ALT 12* 14  ALKPHOS 94 93  BILITOT 0.7 0.9  PROT 7.2 6.7  ALBUMIN 3.1* 2.8*   No results for input(s): LIPASE, AMYLASE in the last 168 hours. Recent Labs  Lab 09/02/17 2244  AMMONIA 21   Coagulation Profile: Recent Labs  Lab 09/02/17 2226  INR 1.01   Cardiac Enzymes: Recent Labs  Lab 09/02/17 2244  CKTOTAL 34*   BNP (last 3 results) No results for input(s): PROBNP in the last 8760 hours. HbA1C: Recent Labs    09/06/17 1055  HGBA1C 7.8*   CBG: Recent Labs  Lab 09/06/17 1946 09/06/17 2313 09/07/17 0330 09/07/17 0747 09/07/17  1201  GLUCAP 181* 181* 207* 251* 171*   Lipid Profile: Recent Labs    09/06/17 1055  CHOL 96  HDL 36*  LDLCALC 42  TRIG 92  CHOLHDL 2.7   Thyroid Function Tests: No results for input(s): TSH, T4TOTAL, FREET4, T3FREE, THYROIDAB in the last 72 hours. Anemia Panel: No results for input(s): VITAMINB12, FOLATE, FERRITIN, TIBC, IRON, RETICCTPCT in the last 72 hours. Sepsis Labs: No results for input(s): PROCALCITON, LATICACIDVEN in the last 168 hours.  Recent Results (from the past 240 hour(s))  MRSA PCR Screening     Status: Abnormal   Collection Time: 09/02/17  9:10 PM  Result Value Ref Range Status   MRSA by PCR POSITIVE (Christine Franklin) NEGATIVE Final    Comment:        The GeneXpert MRSA Assay (FDA approved for NASAL specimens only), is one component of Christine Franklin comprehensive MRSA colonization surveillance program. It is not intended to diagnose MRSA infection nor to guide or monitor treatment for MRSA infections. RESULT CALLED TO, READ BACK BY AND VERIFIED  WITH: G.DICKSON,RN AT 2339 BY L.PITT 09/02/17   Culture, blood (routine x 2)     Status: None   Collection Time: 09/02/17 10:49 PM  Result Value Ref Range Status   Specimen Description BLOOD LEFT HAND  Final   Special Requests   Final    BOTTLES DRAWN AEROBIC AND ANAEROBIC Blood Culture adequate volume   Culture   Final    NO GROWTH 5 DAYS Performed at [facility], [address], [place], [place] [address]    Report Status 09/07/2017 FINAL  Final  Culture, blood (routine x 2)     Status: None   Collection Time: 09/02/17 10:50 PM  Result Value Ref Range Status   Specimen Description BLOOD RIGHT HAND  Final   Special Requests   Final    BOTTLES DRAWN AEROBIC AND ANAEROBIC Blood Culture adequate volume   Culture   Final    NO GROWTH 5 DAYS Performed at [facility], [address] [place], [place] [address]    Report Status 09/07/2017 FINAL  Final  Culture, Urine     Status: Abnormal   Collection Time: 09/04/17  6:39 PM  Result Value Ref Range Status   Specimen Description URINE, CATHETERIZED  Final   Special Requests   Final    Normal Performed at [facility], [address], [place], [place] [address]    Culture MULTIPLE SPECIES PRESENT, SUGGEST RECOLLECTION (Christine Franklin)  Final   Report Status 09/07/2017 FINAL  Final         Radiology Studies: No results found.      Scheduled Meds: . aspirin  325 mg Per Tube Daily  . atorvastatin  40 mg Oral q1800  . carvedilol  6.25 mg Per Tube BID WC  . chlorhexidine  15 mL Mouth Rinse BID  . Chlorhexidine Gluconate Cloth  6 each Topical Q0600  . feeding supplement (PRO-STAT SUGAR FREE 64)  30 mL Per Tube BID  . heparin  5,000 Units Subcutaneous Q8H  . insulin aspart  1-3 Units Subcutaneous Q4H  . lisinopril  2.5 mg Per Tube Daily  . mouth rinse  15 mL Mouth Rinse q12n4p  . montelukast  10 mg Per Tube QHS  . mupirocin ointment  1 application Nasal BID   Continuous Infusions: . sodium chloride 50 mL/hr at  09/07/17 1046  . feeding supplement (JEVITY 1.2 CAL) 1,000 mL (09/07/17 1055)  . levETIRAcetam  Stopped (09/07/17 1153)     LOS: 5 days    Time spent: over 30     Lacretia Nicks, MD [facility] Pager [phone]  If 7PM-7AM, please contact night-coverage [email or web address] Password [facility] 09/07/2017, 5:49 PM

## 2017-09-08 ENCOUNTER — Inpatient Hospital Stay (HOSPITAL_COMMUNITY): Payer: Medicare (Managed Care)

## 2017-09-08 DIAGNOSIS — I6330 Cerebral infarction due to thrombosis of unspecified cerebral artery: Secondary | ICD-10-CM

## 2017-09-08 DIAGNOSIS — I340 Nonrheumatic mitral (valve) insufficiency: Secondary | ICD-10-CM

## 2017-09-08 DIAGNOSIS — I639 Cerebral infarction, unspecified: Secondary | ICD-10-CM

## 2017-09-08 LAB — COMPREHENSIVE METABOLIC PANEL
ALT: 11 U/L — AB (ref 14–54)
AST: 11 U/L — ABNORMAL LOW (ref 15–41)
Albumin: 2.7 g/dL — ABNORMAL LOW (ref 3.5–5.0)
Alkaline Phosphatase: 70 U/L (ref 38–126)
Anion gap: 6 (ref 5–15)
BUN: 15 mg/dL (ref 6–20)
CALCIUM: 8.9 mg/dL (ref 8.9–10.3)
CO2: 24 mmol/L (ref 22–32)
CREATININE: 0.6 mg/dL (ref 0.44–1.00)
Chloride: 112 mmol/L — ABNORMAL HIGH (ref 101–111)
Glucose, Bld: 162 mg/dL — ABNORMAL HIGH (ref 65–99)
Potassium: 3.4 mmol/L — ABNORMAL LOW (ref 3.5–5.1)
Sodium: 142 mmol/L (ref 135–145)
Total Bilirubin: 0.4 mg/dL (ref 0.3–1.2)
Total Protein: 5.9 g/dL — ABNORMAL LOW (ref 6.5–8.1)

## 2017-09-08 LAB — GLUCOSE, CAPILLARY
GLUCOSE-CAPILLARY: 178 mg/dL — AB (ref 65–99)
GLUCOSE-CAPILLARY: 153 mg/dL — AB (ref 65–99)
Glucose-Capillary: 184 mg/dL — ABNORMAL HIGH (ref 65–99)
Glucose-Capillary: 182 mg/dL — ABNORMAL HIGH (ref 65–99)

## 2017-09-08 LAB — CBC
HCT: 30.1 % — ABNORMAL LOW (ref 36.0–46.0)
HEMOGLOBIN: 9.5 g/dL — AB (ref 12.0–15.0)
MCH: 27.2 pg (ref 26.0–34.0)
MCHC: 31.6 g/dL (ref 30.0–36.0)
MCV: 86.2 fL (ref 78.0–100.0)
Platelets: 316 10*3/uL (ref 150–400)
RBC: 3.49 MIL/uL — AB (ref 3.87–5.11)
RDW: 17.1 % — ABNORMAL HIGH (ref 11.5–15.5)
WBC: 7.2 10*3/uL (ref 4.0–10.5)

## 2017-09-08 LAB — MAGNESIUM: MAGNESIUM: 1.4 mg/dL — AB (ref 1.7–2.4)

## 2017-09-08 MED ORDER — POTASSIUM CHLORIDE CRYS ER 20 MEQ PO TBCR
40.0000 meq | EXTENDED_RELEASE_TABLET | Freq: Once | ORAL | Status: AC
  Administered 2017-09-08: 40 meq via ORAL
  Filled 2017-09-08: qty 2

## 2017-09-08 MED ORDER — LEVETIRACETAM 100 MG/ML PO SOLN
1000.0000 mg | Freq: Two times a day (BID) | ORAL | Status: DC
  Administered 2017-09-08 – 2017-09-14 (×12): 1000 mg
  Filled 2017-09-08 (×14): qty 10

## 2017-09-08 MED ORDER — MAGNESIUM SULFATE 2 GM/50ML IV SOLN
2.0000 g | Freq: Once | INTRAVENOUS | Status: AC
  Administered 2017-09-08: 2 g via INTRAVENOUS
  Filled 2017-09-08: qty 50

## 2017-09-08 NOTE — Progress Notes (Signed)
  Echocardiogram 2D Echocardiogram with bubble has been performed.  Christine Franklin 09/08/2017, 8:32 AM

## 2017-09-08 NOTE — Progress Notes (Signed)
PROGRESS NOTE  Christine Franklin XBM:[id number] DOB: 1951-01-17 DOA: 09/02/2017 PCP: [name], MD   LOS: 6 days   Brief Narrative / Interim history: 67 year old female with past medical significant for DM, CAD, CKD stage 3, HLD, obesity who presents from [facility] with concern for subclinical status epilepticus.  The patient functioned independently prior to being originally hospitalized in January after being found unresponsive on her CPAP requiring intubation and treatment for pneumonia. Since that time she has been admitted and intubated twice being treated for sepsis related to pneumonia and UTI. Since January, she has continued to have this ongoing encephalopathy which waxes and wanes per her daughter, medical records note some concern for anoxic injury. She was working with PT/OT at [facility] and was nearing discharge.   The patient was inher baselinestate of health 4/25, herdaughter was visiting, whenthe patientyelled out, looked to her right and started generalized jerking for around 2 minutes. This was not witnessed by staff however patient remained altered from her baseline mental status and non-verbal. No recent known fever or other complaints. Her daughter states she is intermittently able to communicate and speak in sentences and follow commands at baseline; [facility] notes she is at baseline oriented to self, intermittently follows commands, and moves all extremities. Following event she was taken for head CT which was negative, transferred to [facility] ICU, started on Keppra 500 mg BID and EEG performed. Results of EEG unknown. She was transferred to [facility] for continuous EEG in concern for status epilepticus as patient has been unable to return to her baseline mental status. PCCM to admit.  She has had no witnessed seizure activity since admission to the hospital and continuous EEG monitoring showed no evidence of seizure activity. An MRI obtained 4/27 morning has  shown a left parietal subcortical infarct.   She was transferred to the hospitalists service pm 4/30.  Neurology is following and her stroke workup is still in progress.     Assessment & Plan: Principal Problem:   Acute encephalopathy Active Problems:   Generalized tonic-clonic seizure (HCC)   Postictal state (HCC)   Myoclonic jerking   Normocytic anemia   Cerebral thrombosis with cerebral infarction   Acute Stroke -Continue asa, statin -PT/OT recommends LTAC, will discuss with care management for discharge back to [facility] -Echo with bubble as below -Transcranial doppler as below -Carotid dopplers limited due to patients ability to follow instrutions and positioning, but R carotid with "near normal" extracranial vessels.  Vertebrals with antegrade flow.  Normal flow in the R subclavian. -A1c 7.8 -Lipids LDL 42, HDL 36 -Neurology c/s, appreciate recommendations discussed with Dr. Pearlean Brownie today  Seizures -Continue Keppra, appreciate neurology -Ativan prn  Hypertension -Continue lisinopril, Coreg  Chronic Encephalopathy -anoxic brain injury per chart review.  -Delirium precautions, unclear baseline  T2DM -q4 SSI and 3 units q4 with tube feeds  S/p G tube  -Tube feeds per dietition -Insulin as above CBGs well-controlled 160s 170s today  CAD  -coreg, statin, asa -No chest pain  Pressure injuries -Wound seen today, area does not seem to be a pressure injury up with moisture related  History of recurrent pneumonia and UTI  -no si/sx of infection at this point.      DVT prophylaxis: heparin Code Status: Full code Family Communication: daughter present at bedside Disposition Plan: LTACH vs SNF  Consultants:   Neurology   Procedures:   2D echo Study Conclusions - Left ventricle: inferior basal hypokinesis. The cavity size was mildly dilated. Systolic function  was mildly reduced. The estimated ejection fraction was in the range of 45% to 50%. Wall motion  was normal; there were no regional wall motion abnormalities. The study is not technically sufficient to allow evaluation of LV diastolic function. - Mitral valve: There was mild regurgitation. - Atrial septum: No defect or patent foramen ovale was identified. Echo contrast study showed no right-to-left atrial level shunt, at baseline or with provocation.  Transcranial doppler Final Interpretation: Highly suboptimal and limited study due to absent bitemporal windows. Normal mean flow velocities in both vertebral, basilar, ophthalmic and left carotid siphon.  Carotid US Final Interpretation: Right Carotid: The extracranial vessels were near-normal with only minimal wall        thickening or plaque. Vertebrals: Bilateral vertebral arteries demonstrate antegrade flow. Subclavians: Normal flow hemodynamics were seen in the right subclavian artery.  Antimicrobials:  None    Subjective: -minimally verbal, follows commands inconsistently, complains of inability to read but feels better once her daughter placed her reading glasses.  Objective: Vitals:   09/08/17 0351 09/08/17 0418 09/08/17 0755 09/08/17 1139  BP: (!) 149/78  (!) 158/103 137/88  Pulse: 75  97 78  Resp: Temp: 98.9 F (37.2 C)  98.6 F (37 C) 98.2 F (36.8 C)  TempSrc: Oral   Oral  SpO2: 100%  100% 99%  Weight:  112.8 kg (248 lb 10.9 oz)    Height:        Intake/Output Summary (Last 24 hours) at 09/08/2017 1613 Last data filed at 09/08/2017 1500 Gross per 24 hour  Intake 0 ml  Output 400 ml  Net -400 ml   Filed Weights   09/06/17 0600 09/07/17 0500 09/08/17 0418  Weight: 104.7 kg (230 lb 13.2 oz) 106.6 kg (235 lb 0.2 oz) 112.8 kg (248 lb 10.9 oz)    Examination:  Constitutional: NAD Eyes: lids and conjunctivae normal Neck: normal, supple Respiratory: clear to auscultation bilaterally, no wheezing, no crackles.  Cardiovascular: Regular rate and rhythm, no murmurs / rubs / gallops. No LE  edema.  Abdomen: no tenderness.  Skin: no rashes seen Neurologic: Moves all 4 extremities, does not follow commands to comprehensively assess further  Data Reviewed: I have independently reviewed following labs and imaging studies   CBC: Recent Labs  Lab 09/02/17 2226 09/05/17 0649 09/07/17 0224 09/08/17 0653  WBC 9.8 8.4 8.7 7.2  NEUTROABS 4.8 4.5  --   --   HGB 10.6* 10.4* 10.3* 9.5*  HCT 33.8* 33.1* 32.5* 30.1*  MCV 84.7 84.2 84.4 86.2  PLT 390 394 346 316   Basic Metabolic Panel: Recent Labs  Lab 09/02/17 2226 09/05/17 0649 09/07/17 0224 09/08/17 0653  NA 138 141 139 142  K 3.8 3.8 3.7 3.4*  CL 102 104 110 112*  CO2 26 23 19* 24  GLUCOSE 173* 159* 194* 162*  BUN CREATININE 0.71 0.71 0.61 0.60  CALCIUM 9.7 9.5 9.0 8.9  MG 1.7 1.5* 1.7 1.4*  PHOS 4.0  --   --   --    GFR: Estimated Creatinine Clearance: 89.3 mL/min (by C-G formula based on SCr of 0.6 mg/dL). Liver Function Tests: Recent Labs  Lab 09/02/17 2226 09/05/17 0649 09/08/17 0653  AST 15 16 11*  ALT 12* 14 11*  ALKPHOS 94 93 70  BILITOT 0.7 0.9 0.4  PROT 7.2 6.7 5.9*  ALBUMIN 3.1* 2.8* 2.7*   No results for input(s): LIPASE, AMYLASE in the last 168 hours. Recent  Labs  Lab 09/02/17 2244  AMMONIA 21   Coagulation Profile: Recent Labs  Lab 09/02/17 2226  INR 1.01   Cardiac Enzymes: Recent Labs  Lab 09/02/17 2244  CKTOTAL 34*   BNP (last 3 results) No results for input(s): PROBNP in the last 8760 hours. HbA1C: Recent Labs    09/06/17 1055  HGBA1C 7.8*   CBG: Recent Labs  Lab 09/07/17 0747 09/07/17 1201 09/07/17 1931 09/08/17 0754 09/08/17 1325  GLUCAP 251* 171* 174* 178* 184*   Lipid Profile: Recent Labs    09/06/17 1055  CHOL 96  HDL 36*  LDLCALC 42  TRIG 92  CHOLHDL 2.7   Thyroid Function Tests: No results for input(s): TSH, T4TOTAL, FREET4, T3FREE, THYROIDAB in the last 72 hours. Anemia Panel: No results for input(s): VITAMINB12, FOLATE,  FERRITIN, TIBC, IRON, RETICCTPCT in the last 72 hours. Urine analysis:    Component Value Date/Time   COLORURINE AMBER (A) 09/03/2017 0342   APPEARANCEUR CLOUDY (A) 09/03/2017 0342   LABSPEC 1.020 09/03/2017 0342   PHURINE 7.0 09/03/2017 0342   GLUCOSEU NEGATIVE 09/03/2017 0342   HGBUR NEGATIVE 09/03/2017 0342   BILIRUBINUR NEGATIVE 09/03/2017 0342   KETONESUR 5 (A) 09/03/2017 0342   PROTEINUR 100 (A) 09/03/2017 0342   NITRITE NEGATIVE 09/03/2017 0342   LEUKOCYTESUR LARGE (A) 09/03/2017 0342   Sepsis Labs: Invalid input(s): PROCALCITONIN, LACTICIDVEN  Recent Results (from the past 240 hour(s))  MRSA PCR Screening     Status: Abnormal   Collection Time: 09/02/17  9:10 PM  Result Value Ref Range Status   MRSA by PCR POSITIVE (A) NEGATIVE Final    Comment:        The GeneXpert MRSA Assay (FDA approved for NASAL specimens only), is one component of a comprehensive MRSA colonization surveillance program. It is not intended to diagnose MRSA infection nor to guide or monitor treatment for MRSA infections. RESULT CALLED TO, READ BACK BY AND VERIFIED WITH: G.DICKSON,RN AT 2339 BY L.PITT 09/02/17   Culture, blood (routine x 2)     Status: None   Collection Time: 09/02/17 10:49 PM  Result Value Ref Range Status   Specimen Description BLOOD LEFT HAND  Final   Special Requests   Final    BOTTLES DRAWN AEROBIC AND ANAEROBIC Blood Culture adequate volume   Culture   Final    NO GROWTH 5 DAYS Performed at [facility], [address] [place], [place] [address]    Report Status 09/07/2017 FINAL  Final  Culture, blood (routine x 2)     Status: None   Collection Time: 09/02/17 10:50 PM  Result Value Ref Range Status   Specimen Description BLOOD RIGHT HAND  Final   Special Requests   Final    BOTTLES DRAWN AEROBIC AND ANAEROBIC Blood Culture adequate volume   Culture   Final    NO GROWTH 5 DAYS Performed at [facility], [address], [place], [place] [address]     Report Status 09/07/2017 FINAL  Final  Culture, Urine     Status: Abnormal   Collection Time: 09/04/17  6:39 PM  Result Value Ref Range Status   Specimen Description URINE, CATHETERIZED  Final   Special Requests   Final    Normal Performed at [facility], [address] [place], [place] [address]    Culture MULTIPLE SPECIES PRESENT, SUGGEST RECOLLECTION (A)  Final   Report Status 09/07/2017 FINAL  Final      Radiology Studies: No  results found.   Scheduled Meds: . aspirin  325 mg Per Tube Daily  . atorvastatin  40 mg Oral q1800  . carvedilol  6.25 mg Per Tube BID WC  . chlorhexidine  15 mL Mouth Rinse BID  . feeding supplement (PRO-STAT SUGAR FREE 64)  30 mL Per Tube BID  . heparin  5,000 Units Subcutaneous Q8H  . insulin aspart  0-9 Units Subcutaneous Q4H  . insulin aspart  3 Units Subcutaneous Q4H  . levETIRAcetam  1,000 mg Per Tube BID  . lisinopril  2.5 mg Per Tube Daily  . mouth rinse  15 mL Mouth Rinse q12n4p  . montelukast  10 mg Per Tube QHS   Continuous Infusions: . sodium chloride 50 mL/hr at 09/07/17 1908  . feeding supplement (JEVITY 1.2 CAL) 1,000 mL (09/08/17 4098)    Pamella Pert, MD, PhD [facility] Pager [phone]  If 7PM-7AM, please contact night-coverage [email or web address] Password [facility] 09/08/2017, 4:13 PM

## 2017-09-08 NOTE — Consult Note (Signed)
WOC Nurse wound consult note Reason for Consult:Reconsulted to assess pressure injuries.  Patient was seen by my partner, D. Engels  on 09/03/17. Area is not pressure, but moisture related. Wound type:Moisture with sites of previous wound healing noted. Patient is using a PurWIck device for urine containment and also is wearing an adult brief.  There are 2 under pads beneath patient (disposable). Pressure Injury POA: NA Measurement:(coccygeal in the intertriginous (intragluteal) skin fold) Per Friday) Wound bed:Per Friday Drainage (amount, consistency, odor) Scant serous Periwound:Macerated Dressing procedure/placement/frequency:Today I have added a mattress replacement with low air loss feature for moisture management and have provide Nursing with guidance for skin care including turning and repositioning, heel boots, and our house antimicrobial textile for the intertriginous dermatitis at the subpannicular skin fold. Moisture barrier cream is to be used on the area of MASD. Linen between patient and the therapeutic surface should be limited to one bed sheet and one underpad.  WOC nursing team will not follow, but will remain available to this patient, the nursing and medical teams.  Please re-consult if needed. Thanks, Ladona Mow, MSN, RN, GNP, [facility]  Pager# [phone]

## 2017-09-08 NOTE — Evaluation (Addendum)
Occupational Therapy Evaluation Patient Details Name: Christine Franklin MRN: [id number] DOB: Aug 05, 1950 Today's Date: 09/08/2017    History of Present Illness Christine Franklin comes to [facility] from [facility] on 4/25 after an event witnessed by patient's daughter, described as similar to typical tonic-cloinc seizure event. Event took place for 2 minutes, followed by sustained decrease in verbal responsiveness, which persists. The patient was in her "normal state of health" until January 2019, when she was found to be unresponsive while on her home CPAP.  She was hospitalized, requiring endotracheal intubation.  After liberation from mechanical ventilatory support, she failed her swallow evaluation, leading to PEG tube placement.  She was subsequently discharged to skilled nursing facilities and has been in a vicious cycle of recurrent UTIs, pneumonia and intubations.    Clinical Impression   This 67 y/o female presents with the above. Pt was most recently residing in [place]. Pt overall requiring totalA+2 for all aspects of ADL and mobility this session, though showing some progress with static sitting balance and awareness as session progressed. Pt sitting EOB  >10 min during session, initially requiring maxA (+2 for safety) for sitting balance, progressed to maintaining static sitting with close minguard for safety. Pt also demonstrates some righting reactions given increased time and cues. Pt with significant cognitive impairments, intermittently following simple commands given increased time to process and initiate. Pt will benefit from continued acute OT services and recommend continued OT services in [facility] after discharge to progress pt's cognitive, safety and independence with ADLs and mobility.     Follow Up Recommendations  LTACH;Supervision/Assistance - 24 hour    Equipment Recommendations  Other (comment)(TBD in next venue )           Precautions / Restrictions Precautions Precautions:  Fall Precaution Comments: Seizure; moisture wounds on buttock  Restrictions Weight Bearing Restrictions: No      Mobility Bed Mobility Overal bed mobility: Needs Assistance Bed Mobility: Rolling;Sidelying to Sit;Sit to Supine Rolling: Total assist;+2 for physical assistance;+2 for safety/equipment Sidelying to sit: Total assist;+2 for physical assistance;+2 for safety/equipment   Sit to supine: Total assist;+2 for physical assistance;+2 for safety/equipment   General bed mobility comments: totalA+2 for all mobility; pt sitting EOB >10 min during session, progressing to static sitting EOB with MinGuard assist   Transfers                      Balance Overall balance assessment: Needs assistance Sitting-balance support: Feet supported Sitting balance-Leahy Scale: Poor Sitting balance - Comments: pt initially requiring assist to maintain static sitting; progressed to minguard for static sitting balance                                   ADL either performed or assessed with clinical judgement   ADL Overall ADL's : Needs assistance/impaired                                       General ADL Comments: pt currently requiring total assist for all aspects of ADL completion; use of hand over hand for simple grooming ADLs this session including washing face and applying lotion, pt requiring max hand over hand assist to do so; pt sat EOB >10 min during session, progressing with static sitting balance as session continued, requiring minguard end of session to sit EOB;  when brought into trunk extension pt able to correct and bring herself back up into upright position given cues and increased time      Vision   Vision Assessment?: Vision impaired- to be further tested in functional context Additional Comments: pt does not make eye contact or track when provided cues to do so                 Pertinent Vitals/Pain Pain Assessment: Faces Faces Pain  Scale: Hurts a little bit Pain Location: generalized, grimacing with some movements Pain Descriptors / Indicators: Grimacing Pain Intervention(s): Monitored during session;Repositioned          Extremity/Trunk Assessment Upper Extremity Assessment Upper Extremity Assessment: Difficult to assess due to impaired cognition;LUE deficits/detail;RUE deficits/detail RUE Deficits / Details: increased tone noted in bil UEs, pt also appears resistive to certain movements LUE Deficits / Details: increased tone noted in bil UEs, pt also appears resistive to certain movements   Lower Extremity Assessment Lower Extremity Assessment: Defer to PT evaluation       Communication Communication Communication: Expressive difficulties   Cognition Arousal/Alertness: Awake/alert Behavior During Therapy: Flat affect Overall Cognitive Status: Impaired/Different from baseline Area of Impairment: Attention;Following commands;Awareness                   Current Attention Level: Focused   Following Commands: Follows one step commands inconsistently   Awareness: Intellectual   General Comments: pt mumbling throughout session; often only responding "yes" to questions; pt noted to have significant (approx 7 second) delay when following commands, following some simple commands but inconsistently; pt becoming more alert after sitting EOB for increased period of time    General Comments  pt's daughter present during session                Home Living Family/patient expects to be discharged to:: Other (Comment)(LTAC)                                 Additional Comments: Has been at [facility]       Prior Functioning/Environment Level of Independence: Needs assistance        Comments: Unclear detail at this time, but patient has been critically ill, since January 2019, coming in from [place].         OT Problem List: Decreased strength;Impaired balance (sitting and/or  standing);Decreased cognition;Decreased range of motion;Decreased activity tolerance;Impaired UE functional use      OT Treatment/Interventions: Self-care/ADL training;DME and/or AE instruction;Therapeutic activities;Balance training;Therapeutic exercise;Patient/family education;Visual/perceptual remediation/compensation    OT Goals(Current goals can be found in the care plan section) Acute Rehab OT Goals Patient Stated Goal: none stated  OT Goal Formulation: Patient unable to participate in goal setting Time For Goal Achievement: 09/22/17 Potential to Achieve Goals: Fair  OT Frequency: Min 2X/week               Co-evaluation PT/OT/SLP Co-Evaluation/Treatment: Yes Reason for Co-Treatment: Complexity of the patient's impairments (multi-system involvement);For patient/therapist safety;To address functional/ADL transfers;Necessary to address cognition/behavior during functional activity PT goals addressed during session: Mobility/safety with mobility OT goals addressed during session: ADL's and self-care      AM-PAC PT "6 Clicks" Daily Activity     Outcome Measure Help from another person eating meals?: Total Help from another person taking care of personal grooming?: Total Help from another person toileting, which includes using toliet, bedpan, or urinal?: Total Help from another person bathing (  including washing, rinsing, drying)?: Total Help from another person to put on and taking off regular upper body clothing?: Total Help from another person to put on and taking off regular lower body clothing?: Total 6 Click Score: 6   End of Session Nurse Communication: Mobility status  Activity Tolerance: Patient tolerated treatment well Patient left: in bed;with call bell/phone within reach;with bed alarm set;with family/visitor present  OT Visit Diagnosis: Muscle weakness (generalized) (M62.81);Other symptoms and signs involving cognitive function                Time: 7829-5621 OT  Time Calculation (min): 35 min Charges:  OT General Charges $OT Visit: 1 Visit OT Evaluation $OT Eval Moderate Complexity: 1 Mod G-Codes:     Marcy Siren, OT Pager [phone] 09/08/2017   Orlando Penner 09/08/2017, 12:16 PM

## 2017-09-08 NOTE — Progress Notes (Signed)
Stroke Team Progress Note     SUBJECTIVE Patient has not had any further witnessed seizure activity since arrival. The patient daughter is at the bedside  . No new issues Carotid ultrasound shows no significant extracranial stenosis. Transcranial Doppler is suboptimal due to absent right temporal windows. and echocardiogram  Shows mildly depressed ejection fraction of 45-50%. Hemoglobin A1c was 7.2. LDL cholesterol was 42 mg percent. OBJECTIVE Most recent Vital Signs: Temp: 98.2 F (36.8 C) (05/01 1139) Temp Source: Oral (05/01 1139) BP: 137/88 (05/01 1139) Pulse Rate: 78 (05/01 1139) Respiratory Rate: 18 O2 Saturdation: 99%  CBG (last 3)  Recent Labs    09/07/17 1931 09/08/17 0754 09/08/17 1325  GLUCAP 174* 178* 184*    Diet:  Diet Order           Diet NPO time specified  Diet effective now            Activity: bedrest  VTE Prophylaxis: SCDs  Studies: Results for orders placed or performed during the hospital encounter of 09/02/17 (from the past 24 hour(s))  Glucose, capillary     Status: Abnormal   Collection Time: 09/07/17  7:31 PM  Result Value Ref Range   Glucose-Capillary 174 (H) 65 - 99 mg/dL   Comment 1 Notify RN    Comment 2 Document in Chart   CBC     Status: Abnormal   Collection Time: 09/08/17  6:53 AM  Result Value Ref Range   WBC 7.2 4.0 - 10.5 K/uL   RBC 3.49 (L) 3.87 - 5.11 MIL/uL   Hemoglobin 9.5 (L) 12.0 - 15.0 g/dL   HCT 16.1 (L) 09.6 - 04.5 %   MCV 86.2 78.0 - 100.0 fL   MCH 27.2 26.0 - 34.0 pg   MCHC 31.6 30.0 - 36.0 g/dL   RDW 40.9 (H) 81.1 - 91.4 %   Platelets 316 150 - 400 K/uL  Comprehensive metabolic panel     Status: Abnormal   Collection Time: 09/08/17  6:53 AM  Result Value Ref Range   Sodium 142 135 - 145 mmol/L   Potassium 3.4 (L) 3.5 - 5.1 mmol/L   Chloride 112 (H) 101 - 111 mmol/L   CO2 24 22 - 32 mmol/L   Glucose, Bld 162 (H) 65 - 99 mg/dL   BUN 15 6 - 20 mg/dL   Creatinine, Ser 7.82 0.44 - 1.00 mg/dL   Calcium 8.9  8.9 - 95.6 mg/dL   Total Protein 5.9 (L) 6.5 - 8.1 g/dL   Albumin 2.7 (L) 3.5 - 5.0 g/dL   AST 11 (L) 15 - 41 U/L   ALT 11 (L) 14 - 54 U/L   Alkaline Phosphatase 70 38 - 126 U/L   Total Bilirubin 0.4 0.3 - 1.2 mg/dL   GFR calc non Af Amer >60 >60 mL/min   GFR calc Af Amer >60 >60 mL/min   Anion gap 6 5 - 15  Magnesium     Status: Abnormal   Collection Time: 09/08/17  6:53 AM  Result Value Ref Range   Magnesium 1.4 (L) 1.7 - 2.4 mg/dL  Glucose, capillary     Status: Abnormal   Collection Time: 09/08/17  7:54 AM  Result Value Ref Range   Glucose-Capillary 178 (H) 65 - 99 mg/dL  Glucose, capillary     Status: Abnormal   Collection Time: 09/08/17  1:25 PM  Result Value Ref Range   Glucose-Capillary 184 (H) 65 - 99 mg/dL     No results found.  Long term EEG shows generalized slowing and encephalopathy findings but no definite seizure activity Carotid ultrasound shows no significant extracranial stenosis.  Transcranial Doppler is suboptimal due to absent right temporal windows. and  echocardiogram  Shows mildly depressed ejection fraction of 45-50%.  Hemoglobin A1c was 7.2.  LDL cholesterol was 42 mg percent.  Physical Exam:    obese middle-aged African-American lady who is currently not in distress. . Afebrile. Head is nontraumatic. Neck is supple without bruit.    Cardiac exam no murmur or gallop. Lungs are clear to auscultation. Distal pulses are well felt. Neurological Exam : She is awake and interactive She does not follow commands consistently. She mumbles a few nonsensical words and occasional short sentences. She   Follows only midline   commands for me. Pupils irregular but sluggishly reactive. Doll's eye movements are present. Fundi were not visualized. She blinks to threat bilaterally. Face is symmetric. Tongue is midline. Motor system exam she is able to withdraw left more than right upper extremities to painful stimuli  . She withdraws minimally both lower extremities to  painful stimuli.Deep tendon reflexes are 2+ symmetric. Plantars are downgoing. Gait not tested. ASSESSMENT [name] is a 67 y.o. 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 Brain MRI shows punctate left parietal white matter infarct Neurological exam appears to be improving but not back to her baseline. Hospital day # 6  TREATMENT/PLAN Continue Keppra for seizures     Continue aspirin for stroke prevention. Discussion  with the patient and daughter  At bedisde and answered questions about her care.  Stroke team will sign off. Kindly call for questions.  Delia Heady, MD [facility] Pager: [phone] 09/08/2017 1:55 PM

## 2017-09-08 NOTE — Progress Notes (Signed)
TCD completed. Lequan Dobratz Eunice, RDMS, RVT  

## 2017-09-08 NOTE — Progress Notes (Signed)
Physical Therapy Treatment Patient Details Name: Christine Franklin MRN: [id number] DOB: 05-13-1950 Today's Date: 09/08/2017    History of Present Illness Christine Franklin comes to [facility] from [facility] on 4/25 after an event witnessed by patient's daughter, described as similar to typical tonic-cloinc seizure event. Event took place for 2 minutes, followed by sustained decrease in verbal responsiveness, which persists. The patient was in her "normal state of health" until January 2019, when she was found to be unresponsive while on her home CPAP.  She was hospitalized, requiring endotracheal intubation.  After liberation from mechanical ventilatory support, she failed her swallow evaluation, leading to PEG tube placement.  She was subsequently discharged to skilled nursing facilities and has been in a vicious cycle of recurrent UTIs, pneumonia and intubations.     PT Comments    Patient seen for activity progression. Tolerated increased time and EOB and appears more verbal throughout session today although perseverative and inappropriate at times. Patient did show improvements in trunk control and dynamic movement at EOB but does elicit very delayed responses to commands approximately 7-10 second delay. Daughter present at bedside, educated daughter on some techniques for engagement and arousal techniques. Daughter receptive and Adult nurse. Will continue to see and progress as tolerated..   Follow Up Recommendations  LTACH;Supervision/Assistance - 24 hour     Equipment Recommendations  None recommended by PT    Recommendations for Other Services       Precautions / Restrictions Precautions Precautions: Fall Precaution Comments: Seizure; moisture wounds on buttock  Restrictions Weight Bearing Restrictions: No    Mobility  Bed Mobility Overal bed mobility: Needs Assistance Bed Mobility: Rolling;Sidelying to Sit;Sit to Supine Rolling: Total assist;+2 for physical assistance;+2 for  safety/equipment Sidelying to sit: Total assist;+2 for physical assistance;+2 for safety/equipment   Sit to supine: Total assist;+2 for physical assistance;+2 for safety/equipment   General bed mobility comments: totalA+2 for all mobility; pt sitting EOB >10 min during session, progressing to static sitting EOB with MinGuard assist   Transfers                    Ambulation/Gait                 Stairs             Wheelchair Mobility    Modified Rankin (Stroke Patients Only)       Balance Overall balance assessment: Needs assistance Sitting-balance support: Feet supported Sitting balance-Leahy Scale: Poor Sitting balance - Comments: pt initially requiring assist to maintain static sitting; progressed to minguard for static sitting balance                                    Cognition Arousal/Alertness: Awake/alert Behavior During Therapy: Flat affect Overall Cognitive Status: Impaired/Different from baseline Area of Impairment: Attention;Following commands;Awareness                   Current Attention Level: Focused   Following Commands: Follows one step commands inconsistently   Awareness: Intellectual   General Comments: pt mumbling throughout session; often only responding "yes" to questions; pt noted to have significant (approx 7 second) delay when following commands, following some simple commands but inconsistently; pt becoming more alert after sitting EOB for increased period of time       Exercises Other Exercises Other Exercises: EOB sitting balance activities with low level command task performance Other Exercises: dynamic  trunk control activities at EOB Other Exercises: functional task performance with assist    General Comments General comments (skin integrity, edema, etc.): pt's daughter present during session       Pertinent Vitals/Pain Pain Assessment: Faces Faces Pain Scale: Hurts a little bit Pain  Location: generalized, grimacing with some movements Pain Descriptors / Indicators: Grimacing Pain Intervention(s): Monitored during session;Repositioned    Home Living Family/patient expects to be discharged to:: Other (Comment)(LTAC)               Additional Comments: Has been at [facility]     Prior Function Level of Independence: Needs assistance      Comments: Unclear detail at this time, but patient has been critically ill, since January 2019, coming in from [place].    PT Goals (current goals can now be found in the care plan section) Acute Rehab PT Goals Patient Stated Goal: none stated     Frequency    Min 3X/week      PT Plan      Co-evaluation   Reason for Co-Treatment: Complexity of the patient's impairments (multi-system involvement);For patient/therapist safety;To address functional/ADL transfers;Necessary to address cognition/behavior during functional activity PT goals addressed during session: Mobility/safety with mobility OT goals addressed during session: ADL's and self-care      AM-PAC PT "6 Clicks" Daily Activity  Outcome Measure  Difficulty turning over in bed (including adjusting bedclothes, sheets and blankets)?: Unable Difficulty moving from lying on back to sitting on the side of the bed? : Unable Difficulty sitting down on and standing up from a chair with arms (e.g., wheelchair, bedside commode, etc,.)?: Unable Help needed moving to and from a bed to chair (including a wheelchair)?: Total Help needed walking in hospital room?: Total Help needed climbing 3-5 steps with a railing? : Total 6 Click Score: 6    End of Session         PT Visit Diagnosis: Other symptoms and signs involving the nervous system (W09.811)     Time: 9147-8295    Charges:  $Therapeutic Activity: 8-22 mins                    G Codes:       Charlotte Crumb, PT DPT  Board Certified Neurologic Specialist [phone]    Fabio Asa 09/08/2017, 4:00  PM

## 2017-09-08 NOTE — Care Management Note (Signed)
Case Management Note  Patient Details  Name: Christine Franklin MRN: [id number] Date of Birth: 07-12-1950  Subjective/Objective:     Pt admitted with acute encephalopathy. She is from [facility].               Action/Plan: CM spoke to [place] with [facility] and they are feeling this patient is more SNF appropriate at this time. He states she was close to d/c from [facility] to SNF prior to admission to the hospital. CM updated daughter and she is in agreement with SNF at d/c. CSW also updated.   Expected Discharge Date:                  Expected Discharge Plan:  Skilled Nursing Facility  In-House Referral:  Clinical Social Work  Discharge planning Services  CM Consult  Post Acute Care Choice:    Choice offered to:     DME Arranged:    DME Agency:     HH Arranged:    HH Agency:     Status of Service:  In process, will continue to follow  If discussed at Long Length of Stay Meetings, dates discussed:    Additional Comments:  Kermit Balo, RN 09/08/2017, 11:40 AM

## 2017-09-09 LAB — CBC
HCT: 29.4 % — ABNORMAL LOW (ref 36.0–46.0)
HEMOGLOBIN: 9.2 g/dL — AB (ref 12.0–15.0)
MCH: 27.1 pg (ref 26.0–34.0)
MCHC: 31.3 g/dL (ref 30.0–36.0)
MCV: 86.5 fL (ref 78.0–100.0)
Platelets: 297 10*3/uL (ref 150–400)
RBC: 3.4 MIL/uL — AB (ref 3.87–5.11)
RDW: 16.8 % — ABNORMAL HIGH (ref 11.5–15.5)
WBC: 7 10*3/uL (ref 4.0–10.5)

## 2017-09-09 LAB — BASIC METABOLIC PANEL
ANION GAP: 6 (ref 5–15)
BUN: 13 mg/dL (ref 6–20)
CHLORIDE: 112 mmol/L — AB (ref 101–111)
CO2: 25 mmol/L (ref 22–32)
Calcium: 8.8 mg/dL — ABNORMAL LOW (ref 8.9–10.3)
Creatinine, Ser: 0.61 mg/dL (ref 0.44–1.00)
GFR calc non Af Amer: >60 mL/min (ref >60)
Glucose, Bld: 162 mg/dL — ABNORMAL HIGH (ref 65–99)
POTASSIUM: 3.5 mmol/L (ref 3.5–5.1)
SODIUM: 143 mmol/L (ref 135–145)

## 2017-09-09 LAB — GLUCOSE, CAPILLARY
GLUCOSE-CAPILLARY: 215 mg/dL — AB (ref 65–99)
GLUCOSE-CAPILLARY: 172 mg/dL — AB (ref 65–99)
GLUCOSE-CAPILLARY: 151 mg/dL — AB (ref 65–99)
GLUCOSE-CAPILLARY: 155 mg/dL — AB (ref 65–99)
GLUCOSE-CAPILLARY: 152 mg/dL — AB (ref 65–99)
Glucose-Capillary: 128 mg/dL — ABNORMAL HIGH (ref 65–99)
Glucose-Capillary: 139 mg/dL — ABNORMAL HIGH (ref 65–99)
Glucose-Capillary: 164 mg/dL — ABNORMAL HIGH (ref 65–99)
Glucose-Capillary: 171 mg/dL — ABNORMAL HIGH (ref 65–99)

## 2017-09-09 NOTE — Progress Notes (Signed)
CSW met with patient's daughter at bedside to discuss discharge needs. CSW received contact information and fax number for [facility] in [place]. CSW contacted Shawna in Admissions to discuss patient status; CSW faxed referral.  CSW will await call back from [facility].  Laveda Abbe, [place] Clinical Social Worker [phone]

## 2017-09-09 NOTE — Progress Notes (Signed)
CSW following for discharge plan. CSW contacted Admissions at [facility] earlier today to check on status; was still under review. CSW called back later and received information that patient's insurance needs clinical updates sent for approval. CSW received contact and fax number to send updates to for insurance approval. CSW faxed updates to insurance company.  CSW will continue to follow.  Blenda Nicely, [place] Clinical Social Worker [phone]

## 2017-09-09 NOTE — Progress Notes (Signed)
PROGRESS NOTE  Christine Franklin WUJ:[id number] DOB: 02/25/1951 DOA: 09/02/2017 PCP: [name], MD   LOS: 7 days   Brief Narrative / Interim history: 67 year old female with past medical significant for DM, CAD, CKD stage 3, HLD, obesity who presents from [facility] with concern for subclinical status epilepticus.  The patient functioned independently prior to being originally hospitalized in January after being found unresponsive on her CPAP requiring intubation and treatment for pneumonia. Since that time she has been admitted and intubated twice being treated for sepsis related to pneumonia and UTI. Since January, she has continued to have this ongoing encephalopathy which waxes and wanes per her daughter, medical records note some concern for anoxic injury. She was working with PT/OT at [facility] and was nearing discharge.   The patient was inher baselinestate of health 4/25, herdaughter was visiting, whenthe patientyelled out, looked to her right and started generalized jerking for around 2 minutes. This was not witnessed by staff however patient remained altered from her baseline mental status and non-verbal. No recent known fever or other complaints. Her daughter states she is intermittently able to communicate and speak in sentences and follow commands at baseline; [facility] notes she is at baseline oriented to self, intermittently follows commands, and moves all extremities. Following event she was taken for head CT which was negative, transferred to [facility] ICU, started on Keppra 500 mg BID and EEG performed. Results of EEG unknown. She was transferred to [facility] for continuous EEG in concern for status epilepticus as patient has been unable to return to her baseline mental status. PCCM to admit.  She has had no witnessed seizure activity since admission to the hospital and continuous EEG monitoring showed no evidence of seizure activity. An MRI obtained 4/27 morning has  shown a left parietal subcortical infarct.   She was transferred to the hospitalists service pm 4/30.  Neurology is following and her stroke workup is still in progress.     Assessment & Plan: Principal Problem:   Acute encephalopathy Active Problems:   Generalized tonic-clonic seizure (HCC)   Postictal state (HCC)   Myoclonic jerking   Normocytic anemia   Cerebral thrombosis with cerebral infarction   Acute Stroke -Continue asa, statin -PT/OT recommends LTAC, will discuss with care management for discharge back to [facility] -Echo with bubble as below -Transcranial doppler as below -Carotid dopplers limited due to patients ability to follow instrutions and positioning, but R carotid with "near normal" extracranial vessels.  Vertebrals with antegrade flow.  Normal flow in the R subclavian. -A1c 7.8 -Lipids LDL 42, HDL 36 -Neurology c/s, work up complete, awaiting SNF placement   Seizures -Continue Keppra, appreciate neurology -Ativan prn  Hypertension -Continue lisinopril, Coreg  Chronic Encephalopathy -anoxic brain injury per chart review.  -Delirium precautions, unclear baseline  T2DM -q4 SSI and 3 units q4 with tube feeds  S/p G tube  -Tube feeds per dietition -Insulin as above CBGs well-controlled 160s 170s today  CAD  -coreg, statin, asa -No chest pain  Pressure injuries -Wound seen today, area does not seem to be a pressure injury up with moisture related  History of recurrent pneumonia and UTI  -no si/sx of infection at this point.      DVT prophylaxis: heparin Code Status: Full code Family Communication: daughter present at bedside Disposition Plan: LTACH vs SNF  Consultants:   Neurology   Procedures:   2D echo Study Conclusions - Left ventricle: inferior basal hypokinesis. The cavity size was mildly dilated. Systolic function  was mildly reduced. The estimated ejection fraction was in the range of 45% to 50%. Wall motion was normal;  there were no regional wall motion abnormalities. The study is not technically sufficient to allow evaluation of LV diastolic function. - Mitral valve: There was mild regurgitation. - Atrial septum: No defect or patent foramen ovale was identified. Echo contrast study showed no right-to-left atrial level shunt, at baseline or with provocation.  Transcranial doppler Final Interpretation: Highly suboptimal and limited study due to absent bitemporal windows. Normal mean flow velocities in both vertebral, basilar, ophthalmic and left carotid siphon.  Carotid US Final Interpretation: Right Carotid: The extracranial vessels were near-normal with only minimal wall        thickening or plaque. Vertebrals: Bilateral vertebral arteries demonstrate antegrade flow. Subclavians: Normal flow hemodynamics were seen in the right subclavian artery.  Antimicrobials:  None    Subjective: -no complaints.   Objective: Vitals:   09/08/17 2359 09/09/17 0302 09/09/17 0818 09/09/17 1114  BP: 133/85 (!) 149/67 (!) 159/73 (!) 159/89  Pulse: 91 72 79 77  Resp: Temp: 99.1 F (37.3 C) 98.7 F (37.1 C) 99 F (37.2 C) (!) 97.5 F (36.4 C)  TempSrc: Oral Axillary Oral Axillary  SpO2: 96% 96% 100% 99%  Weight:      Height:        Intake/Output Summary (Last 24 hours) at 09/09/2017 1526 Last data filed at 09/09/2017 1300 Gross per 24 hour  Intake 0 ml  Output 650 ml  Net -650 ml   Filed Weights   09/06/17 0600 09/07/17 0500 09/08/17 0418  Weight: 104.7 kg (230 lb 13.2 oz) 106.6 kg (235 lb 0.2 oz) 112.8 kg (248 lb 10.9 oz)    Examination:  Constitutional: NAD Respiratory: CTA Cardiovascular: RRR  Data Reviewed: I have independently reviewed following labs and imaging studies   CBC: Recent Labs  Lab 09/02/17 2226 09/05/17 0649 09/07/17 0224 09/08/17 0653 09/09/17 0515  WBC 9.8 8.4 8.7 7.2 7.0  NEUTROABS 4.8 4.5  --   --   --   HGB 10.6* 10.4* 10.3* 9.5* 9.2*  HCT  33.8* 33.1* 32.5* 30.1* 29.4*  MCV 84.7 84.2 84.4 86.2 86.5  PLT 390 394 346 316 297   Basic Metabolic Panel: Recent Labs  Lab 09/02/17 2226 09/05/17 0649 09/07/17 0224 09/08/17 0653 09/09/17 0515  NA 138 141 139 142 143  K 3.8 3.8 3.7 3.4* 3.5  CL 102 104 110 112* 112*  CO2 26 23 19* 24 25  GLUCOSE 173* 159* 194* 162* 162*  BUN CREATININE 0.71 0.71 0.61 0.60 0.61  CALCIUM 9.7 9.5 9.0 8.9 8.8*  MG 1.7 1.5* 1.7 1.4*  --   PHOS 4.0  --   --   --   --    GFR: Estimated Creatinine Clearance: 89.3 mL/min (by C-G formula based on SCr of 0.61 mg/dL). Liver Function Tests: Recent Labs  Lab 09/02/17 2226 09/05/17 0649 09/08/17 0653  AST 15 16 11*  ALT 12* 14 11*  ALKPHOS 94 93 70  BILITOT 0.7 0.9 0.4  PROT 7.2 6.7 5.9*  ALBUMIN 3.1* 2.8* 2.7*   No results for input(s): LIPASE, AMYLASE in the last 168 hours. Recent Labs  Lab 09/02/17 2244  AMMONIA 21   Coagulation Profile: Recent Labs  Lab 09/02/17 2226  INR 1.01   Cardiac Enzymes: Recent Labs  Lab 09/02/17 2244  CKTOTAL 34*   BNP (last 3 results) No  results for input(s): PROBNP in the last 8760 hours. HbA1C: No results for input(s): HGBA1C in the last 72 hours. CBG: Recent Labs  Lab 09/08/17 2029 09/09/17 0041 09/09/17 0430 09/09/17 0820 09/09/17 1112  GLUCAP 153* 128* 139* 151* 155*   Lipid Profile: No results for input(s): CHOL, HDL, LDLCALC, TRIG, CHOLHDL, LDLDIRECT in the last 72 hours. Thyroid Function Tests: No results for input(s): TSH, T4TOTAL, FREET4, T3FREE, THYROIDAB in the last 72 hours. Anemia Panel: No results for input(s): VITAMINB12, FOLATE, FERRITIN, TIBC, IRON, RETICCTPCT in the last 72 hours. Urine analysis:    Component Value Date/Time   COLORURINE AMBER (A) 09/03/2017 0342   APPEARANCEUR CLOUDY (A) 09/03/2017 0342   LABSPEC 1.020 09/03/2017 0342   PHURINE 7.0 09/03/2017 0342   GLUCOSEU NEGATIVE 09/03/2017 0342   HGBUR NEGATIVE 09/03/2017 0342    BILIRUBINUR NEGATIVE 09/03/2017 0342   KETONESUR 5 (A) 09/03/2017 0342   PROTEINUR 100 (A) 09/03/2017 0342   NITRITE NEGATIVE 09/03/2017 0342   LEUKOCYTESUR LARGE (A) 09/03/2017 0342   Sepsis Labs: Invalid input(s): PROCALCITONIN, LACTICIDVEN  Recent Results (from the past 240 hour(s))  MRSA PCR Screening     Status: Abnormal   Collection Time: 09/02/17  9:10 PM  Result Value Ref Range Status   MRSA by PCR POSITIVE (A) NEGATIVE Final    Comment:        The GeneXpert MRSA Assay (FDA approved for NASAL specimens only), is one component of a comprehensive MRSA colonization surveillance program. It is not intended to diagnose MRSA infection nor to guide or monitor treatment for MRSA infections. RESULT CALLED TO, READ BACK BY AND VERIFIED WITH: G.DICKSON,RN AT 2339 BY L.PITT 09/02/17   Culture, blood (routine x 2)     Status: None   Collection Time: 09/02/17 10:49 PM  Result Value Ref Range Status   Specimen Description BLOOD LEFT HAND  Final   Special Requests   Final    BOTTLES DRAWN AEROBIC AND ANAEROBIC Blood Culture adequate volume   Culture   Final    NO GROWTH 5 DAYS Performed at [facility], [address] [place], [place] [address]    Report Status 09/07/2017 FINAL  Final  Culture, blood (routine x 2)     Status: None   Collection Time: 09/02/17 10:50 PM  Result Value Ref Range Status   Specimen Description BLOOD RIGHT HAND  Final   Special Requests   Final    BOTTLES DRAWN AEROBIC AND ANAEROBIC Blood Culture adequate volume   Culture   Final    NO GROWTH 5 DAYS Performed at [facility], [address], [place], [place] [address]    Report Status 09/07/2017 FINAL  Final  Culture, Urine     Status: Abnormal   Collection Time: 09/04/17  6:39 PM  Result Value Ref Range Status   Specimen Description URINE, CATHETERIZED  Final   Special Requests   Final    Normal Performed at [facility], [address], [place], [place] [address]     Culture MULTIPLE SPECIES PRESENT, SUGGEST RECOLLECTION (A)  Final   Report Status 09/07/2017 FINAL  Final      Radiology Studies: No results found.   Scheduled Meds: . aspirin  325 mg Per Tube Daily  . atorvastatin  40 mg Oral q1800  . carvedilol  6.25 mg Per Tube BID WC  . chlorhexidine  15 mL Mouth Rinse BID  . feeding supplement (PRO-STAT SUGAR FREE 64)  30 mL  Per Tube BID  . heparin  5,000 Units Subcutaneous Q8H  . insulin aspart  0-9 Units Subcutaneous Q4H  . insulin aspart  3 Units Subcutaneous Q4H  . levETIRAcetam  1,000 mg Per Tube BID  . lisinopril  2.5 mg Per Tube Daily  . mouth rinse  15 mL Mouth Rinse q12n4p  . montelukast  10 mg Per Tube QHS   Continuous Infusions: . sodium chloride 50 mL/hr at 09/08/17 1712  . feeding supplement (JEVITY 1.2 CAL) 1,000 mL (09/08/17 0454)    Pamella Pert, MD, PhD [facility] Pager [phone]  If 7PM-7AM, please contact night-coverage [email or web address] Password [facility] 09/09/2017, 3:26 PM

## 2017-09-09 NOTE — Progress Notes (Signed)
Nutrition Follow-up  DOCUMENTATION CODES:   Obesity unspecified  INTERVENTION:  Continue tube feeding via PEG: - Continue Jevity 1.2 @ 50 ml/hr (1200 ml/day) - 30 ml Pro-stat BID - free water flushes per MD  Tube feeding regimen provides 1640 kcal, 97 grams of protein, and 972 ml of H2O.   NUTRITION DIAGNOSIS:   Inadequate oral intake related to inability to eat as evidenced by NPO status. -ongoing  GOAL:   Patient will meet greater than or equal to 90% of their needs -met with TF  MONITOR:   Labs, Skin, TF tolerance, Weight trends   ASSESSMENT:   67 year old female who transferred from [facility] after a witnessed seizure-like event. Pt hag PEG tube placed in January 2019 s/p extubation and failure of swallow evaluation. Pt has been admitted and intubated twice since this time. PMH significant for diabetes mellitus, CAD, CKD stage 3, and hyperlipidemia.  Resting comfortably at bedside. Discussed with RN and daughter. Tolerating tube feeds.  Continues to exhibit chronic encephalopathy that comes and goes as a result of previous anoxic brain injury.  Exhibiting weight fluctuations, up 13 pounds. 5L Fluid positive. 651m UOP last 24 hrs  Labs reviewed:  CBGs 155, 151, 139  Medications reviewed and include:  NS at 530mhr Insulin  Diet Order:   Diet Order           Diet NPO time specified  Diet effective now          EDUCATION NEEDS:   No education needs have been identified at this time  Skin:  Skin Assessment: Reviewed RN Assessment(moisture associated skin damage to groin and perineum)  Last BM:  09/05/17 - small type 6  Height:   Ht Readings from Last 1 Encounters:  09/06/17 5' 6"  (1.676 m)    Weight:   Wt Readings from Last 1 Encounters:  09/08/17 248 lb 10.9 oz (112.8 kg)    Ideal Body Weight:  59.1 kg  BMI:  Body mass index is 40.14 kg/m.  Estimated Nutritional Needs:   Kcal:  1600-1800 kcal/day  Protein:  85-100 grams/day  Fluid:   1.6-1.8 L/day  WiSatira AnisWard, MS, RD LDN Inpatient Clinical Dietitian Pager [phone]

## 2017-09-10 ENCOUNTER — Inpatient Hospital Stay (HOSPITAL_COMMUNITY): Payer: Medicare (Managed Care)

## 2017-09-10 ENCOUNTER — Encounter (HOSPITAL_COMMUNITY): Payer: Self-pay | Admitting: Interventional Radiology

## 2017-09-10 HISTORY — PX: IR REPLC GASTRO/COLONIC TUBE PERCUT W/FLUORO: IMG2333

## 2017-09-10 LAB — GLUCOSE, CAPILLARY
GLUCOSE-CAPILLARY: 151 mg/dL — AB (ref 65–99)
GLUCOSE-CAPILLARY: 154 mg/dL — AB (ref 65–99)
GLUCOSE-CAPILLARY: 162 mg/dL — AB (ref 65–99)
GLUCOSE-CAPILLARY: 187 mg/dL — AB (ref 65–99)
Glucose-Capillary: 156 mg/dL — ABNORMAL HIGH (ref 65–99)
Glucose-Capillary: 138 mg/dL — ABNORMAL HIGH (ref 65–99)
Glucose-Capillary: 99 mg/dL (ref 65–99)

## 2017-09-10 MED ORDER — IOPAMIDOL (ISOVUE-300) INJECTION 61%
INTRAVENOUS | Status: AC
  Administered 2017-09-10: 10 mL
  Filled 2017-09-10: qty 50

## 2017-09-10 MED ORDER — LIDOCAINE VISCOUS 2 % MT SOLN
OROMUCOSAL | Status: AC
  Administered 2017-09-10: 14:00:00
  Filled 2017-09-10: qty 15

## 2017-09-10 NOTE — Progress Notes (Signed)
PROGRESS NOTE  Christine Franklin UEA:[id number] DOB: January 24, 1951 DOA: 09/02/2017 PCP: [name], MD   LOS: 8 days   Brief Narrative / Interim history: 67 year old female with past medical significant for DM, CAD, CKD stage 3, HLD, obesity who presents from [facility] with concern for subclinical status epilepticus.  The patient functioned independently prior to being originally hospitalized in January after being found unresponsive on her CPAP requiring intubation and treatment for pneumonia. Since that time she has been admitted and intubated twice being treated for sepsis related to pneumonia and UTI. Since January, she has continued to have this ongoing encephalopathy which waxes and wanes per her daughter, medical records note some concern for anoxic injury. She was working with PT/OT at [facility] and was nearing discharge.   The patient was inher baselinestate of health 4/25, herdaughter was visiting, whenthe patientyelled out, looked to her right and started generalized jerking for around 2 minutes. This was not witnessed by staff however patient remained altered from her baseline mental status and non-verbal. No recent known fever or other complaints. Her daughter states she is intermittently able to communicate and speak in sentences and follow commands at baseline; [facility] notes she is at baseline oriented to self, intermittently follows commands, and moves all extremities. Following event she was taken for head CT which was negative, transferred to [facility] ICU, started on Keppra 500 mg BID and EEG performed. Results of EEG unknown. She was transferred to [facility] for continuous EEG in concern for status epilepticus as patient has been unable to return to her baseline mental status. PCCM to admit.  She has had no witnessed seizure activity since admission to the hospital and continuous EEG monitoring showed no evidence of seizure activity. An MRI obtained 4/27 morning has  shown a left parietal subcortical infarct.   She was transferred to the hospitalists service pm 4/30.   Assessment & Plan: Principal Problem:   Acute encephalopathy Active Problems:   Generalized tonic-clonic seizure (HCC)   Postictal state (HCC)   Myoclonic jerking   Normocytic anemia   Cerebral thrombosis with cerebral infarction   Acute Stroke -patient is admitted from [facility] with encephalopathy, MRI on admission showed advanced chronic changes of atrophy and small vessel disease with superimposed acute left parietal subcortical white matter infarct.  Neurology was consulted and had followed patient while hospitalized.  She underwent a 2D echo which showed an EF of 45 to 50%, transcranial Doppler without significant findings however difficult study. Carotid dopplers limited due to patients ability to follow instrutions and positioning, but R carotid with "near normal" extracranial vessels.  Vertebrals with antegrade flow.  Normal flow in the R subclavian. A1c 7.8 Lipids LDL 42, HDL 36.  Tinea aspirin for stroke prevention. Seizures -partial onset seizure secondary generalization.  Continue Keppra, appreciate neurology.  No further seizures Hypertension -Continue lisinopril, Coreg Chronic Encephalopathy -anoxic brain injury per chart review.  Appears close to baseline T2DM -q4 SSI and 3 units q4 with tube feeds PEG tube present / malfunction -PEG tube was placed in January 2018 at [facility].  They were concerned from the RN that the PEG tube is dislodged, interventional radiology consulted today, evaluated patient, underwent a CT scan of the abdomen pelvis which showed that the PEG tube was out.  Successfully replaced on 5/3 and currently functioning well. Tube feeds per dietition CAD  -coreg, statin, asa, No chest pain Pressure injuries -not a pressure injury up with moisture related History of recurrent pneumonia and UTI  -  no si/sx of infection at this point.    DVT  prophylaxis: heparin Code Status: Full code Family Communication: daughter present at bedside Disposition Plan: SNF  Consultants:   Neurology   Procedures:   2D echo Study Conclusions - Left ventricle: inferior basal hypokinesis. The cavity size was mildly dilated. Systolic function was mildly reduced. The estimated ejection fraction was in the range of 45% to 50%. Wall motion was normal; there were no regional wall motion abnormalities. The study is not technically sufficient to allow evaluation of LV diastolic function. - Mitral valve: There was mild regurgitation. - Atrial septum: No defect or patent foramen ovale was identified. Echo contrast study showed no right-to-left atrial level shunt, at baseline or with provocation.  Transcranial doppler Final Interpretation: Highly suboptimal and limited study due to absent bitemporal windows. Normal mean flow velocities in both vertebral, basilar, ophthalmic and left carotid siphon.  Carotid US Final Interpretation: Right Carotid: The extracranial vessels were near-normal with only minimal wall        thickening or plaque. Vertebrals: Bilateral vertebral arteries demonstrate antegrade flow. Subclavians: Normal flow hemodynamics were seen in the right subclavian artery.  Antimicrobials:  None    Subjective: - no chest pain, shortness of breath, complains of abdominal pain, but no nausea or vomiting.   Objective: Vitals:   09/09/17 2343 09/10/17 0416 09/10/17 0806 09/10/17 1142  BP: (!) 154/80 (!) 165/87 (!) 155/71 136/90  Pulse: 84 83 75 76  Resp: Temp: 99.3 F (37.4 C) 99.1 F (37.3 C) 98.8 F (37.1 C) 98.9 F (37.2 C)  TempSrc: Oral Oral Axillary Axillary  SpO2: 98% 100% 100% 100%  Weight:      Height:        Intake/Output Summary (Last 24 hours) at 09/10/2017 1559 Last data filed at 09/10/2017 1400 Gross per 24 hour  Intake 0 ml  Output 450 ml  Net -450 ml   Filed Weights   09/07/17 0500  09/08/17 0418 09/09/17 1546  Weight: 106.6 kg (235 lb 0.2 oz) 112.8 kg (248 lb 10.9 oz) 113.4 kg (250 lb)    Examination:  Constitutional: NAD, calm, comfortable Eyes: PERRL, lids and conjunctivae normal ENMT: Mucous membranes are moist.  Neck: normal, supple Respiratory: clear to auscultation bilaterally, no wheezing, no crackles. Normal respiratory effort.  Cardiovascular: Regular rate and rhythm, no murmurs / rubs / gallops. No LE edema. 2+ pedal pulses.  Abdomen: tender around the PEG tube. Bowel sounds positive.  Skin: no rashes, lesions, ulcers. No induration Neurologic: non focal  Data Reviewed: I have independently reviewed following labs and imaging studies   CBC: Recent Labs  Lab 09/05/17 0649 09/07/17 0224 09/08/17 0653 09/09/17 0515  WBC 8.4 8.7 7.2 7.0  NEUTROABS 4.5  --   --   --   HGB 10.4* 10.3* 9.5* 9.2*  HCT 33.1* 32.5* 30.1* 29.4*  MCV 84.2 84.4 86.2 86.5  PLT 394 346 316 297   Basic Metabolic Panel: Recent Labs  Lab 09/05/17 0649 09/07/17 0224 09/08/17 0653 09/09/17 0515  NA 141 139 142 143  K 3.8 3.7 3.4* 3.5  CL 104 110 112* 112*  CO2 23 19* 24 25  GLUCOSE 159* 194* 162* 162*  BUN CREATININE 0.71 0.61 0.60 0.61  CALCIUM 9.5 9.0 8.9 8.8*  MG 1.5* 1.7 1.4*  --    GFR: Estimated Creatinine Clearance: 89.5 mL/min (by C-G formula based on SCr of 0.61 mg/dL). Liver Function Tests:  Recent Labs  Lab 09/05/17 0649 09/08/17 0653  AST 16 11*  ALT 14 11*  ALKPHOS 93 70  BILITOT 0.9 0.4  PROT 6.7 5.9*  ALBUMIN 2.8* 2.7*   No results for input(s): LIPASE, AMYLASE in the last 168 hours. No results for input(s): AMMONIA in the last 168 hours. Coagulation Profile: No results for input(s): INR, PROTIME in the last 168 hours. Cardiac Enzymes: No results for input(s): CKTOTAL, CKMB, CKMBINDEX, TROPONINI in the last 168 hours. BNP (last 3 results) No results for input(s): PROBNP in the last 8760 hours. HbA1C: No results for  input(s): HGBA1C in the last 72 hours. CBG: Recent Labs  Lab 09/09/17 2024 09/10/17 0001 09/10/17 0413 09/10/17 0809 09/10/17 1107  GLUCAP 152* 154* 138* 187* 151*   Lipid Profile: No results for input(s): CHOL, HDL, LDLCALC, TRIG, CHOLHDL, LDLDIRECT in the last 72 hours. Thyroid Function Tests: No results for input(s): TSH, T4TOTAL, FREET4, T3FREE, THYROIDAB in the last 72 hours. Anemia Panel: No results for input(s): VITAMINB12, FOLATE, FERRITIN, TIBC, IRON, RETICCTPCT in the last 72 hours. Urine analysis:    Component Value Date/Time   COLORURINE AMBER (A) 09/03/2017 0342   APPEARANCEUR CLOUDY (A) 09/03/2017 0342   LABSPEC 1.020 09/03/2017 0342   PHURINE 7.0 09/03/2017 0342   GLUCOSEU NEGATIVE 09/03/2017 0342   HGBUR NEGATIVE 09/03/2017 0342   BILIRUBINUR NEGATIVE 09/03/2017 0342   KETONESUR 5 (A) 09/03/2017 0342   PROTEINUR 100 (A) 09/03/2017 0342   NITRITE NEGATIVE 09/03/2017 0342   LEUKOCYTESUR LARGE (A) 09/03/2017 0342   Sepsis Labs: Invalid input(s): PROCALCITONIN, LACTICIDVEN  Recent Results (from the past 240 hour(s))  MRSA PCR Screening     Status: Abnormal   Collection Time: 09/02/17  9:10 PM  Result Value Ref Range Status   MRSA by PCR POSITIVE (A) NEGATIVE Final    Comment:        The GeneXpert MRSA Assay (FDA approved for NASAL specimens only), is one component of a comprehensive MRSA colonization surveillance program. It is not intended to diagnose MRSA infection nor to guide or monitor treatment for MRSA infections. RESULT CALLED TO, READ BACK BY AND VERIFIED WITH: G.DICKSON,RN AT 2339 BY L.PITT 09/02/17   Culture, blood (routine x 2)     Status: None   Collection Time: 09/02/17 10:49 PM  Result Value Ref Range Status   Specimen Description BLOOD LEFT HAND  Final   Special Requests   Final    BOTTLES DRAWN AEROBIC AND ANAEROBIC Blood Culture adequate volume   Culture   Final    NO GROWTH 5 DAYS Performed at [facility], [address] [place], [place] [address]    Report Status 09/07/2017 FINAL  Final  Culture, blood (routine x 2)     Status: None   Collection Time: 09/02/17 10:50 PM  Result Value Ref Range Status   Specimen Description BLOOD RIGHT HAND  Final   Special Requests   Final    BOTTLES DRAWN AEROBIC AND ANAEROBIC Blood Culture adequate volume   Culture   Final    NO GROWTH 5 DAYS Performed at [facility], [address], [place], [place] [address]    Report Status 09/07/2017 FINAL  Final  Culture, Urine     Status: Abnormal   Collection Time: 09/04/17  6:39 PM  Result Value Ref Range Status   Specimen Description URINE, CATHETERIZED  Final   Special Requests   Final    Normal Performed at [facility]  Lab, [address], [place], [place] [address]    Culture MULTIPLE SPECIES PRESENT, SUGGEST RECOLLECTION (A)  Final   Report Status 09/07/2017 FINAL  Final      Radiology Studies: Ct Abdomen Wo Contrast  Result Date: 09/10/2017 CLINICAL DATA:  Dysphagia, malposition gastrostomy tube. EXAM: CT ABDOMEN WITHOUT CONTRAST TECHNIQUE: Multidetector CT imaging of the abdomen was performed following the standard protocol without IV contrast. COMPARISON:  Radiographs of same day. FINDINGS: Lower chest: No acute abnormality. Hepatobiliary: No focal liver abnormality is seen. Status post cholecystectomy. No biliary dilatation. Pancreas: Unremarkable. No pancreatic ductal dilatation or surrounding inflammatory changes. Spleen: Normal in size without focal abnormality. Adrenals/Urinary Tract: Adrenal glands appear normal. Multiple rounded hyperdense exophytic abnormalities are seen involving both kidneys most consistent with hyperdense cysts. No hydronephrosis or renal obstruction is noted. No renal or ureteral calculi are noted. Stomach/Bowel: Diverticulosis of the colon is noted without inflammation. There is no evidence of bowel obstruction. Gastrostomy tract is noted in the left upper quadrant abdominal  wall, but the gastrostomy tube appears to be completely external. Vascular/Lymphatic: Aortic atherosclerosis. No enlarged abdominal lymph nodes. Other: No abdominal wall hernia is noted.  No free fluid is noted. Musculoskeletal: No acute or significant osseous findings. IMPRESSION: Gastrostomy tube appears to have been pulled out and is completely external. Multiple rounded hyperdense exophytic abnormalities are seen involving both kidneys most consistent with hyperdense cysts, but ultrasound is recommended to rule out solid neoplasm. Electronically Signed   By: Lupita Raider, M.D.   On: 09/10/2017 12:58   Ir Replc Gastro/colonic Tube Percut W/fluoro  Result Date: 09/10/2017 INDICATION: Chronic indwelling percutaneous gastrostomy tube has become dislodged. EXAM: REPLACEMENT OF GASTROSTOMY TUBE UNDER FLUOROSCOPY MEDICATIONS: None ANESTHESIA/SEDATION: None CONTRAST:  10 mL Isovue-300 injected into the stomach FLUOROSCOPY TIME:  Fluoroscopy Time: 6 seconds.  4.6 mGy. COMPLICATIONS: None immediate. PROCEDURE: After inspected in the exit site of the previously placed and now completely dislodged percutaneous gastrostomy tube, an 20 French balloon retention gastrostomy tube was advanced. The retention balloon was inflated 8 mL of saline. The tube was injected with contrast material and a fluoroscopic spot image obtained and saved to confirm position. FINDINGS: A balloon retention catheter was able to be advanced through the pre-existing tract and into the gastric lumen. Contrast injection confirms intraluminal positioning of the catheter at the level of the body of the stomach. IMPRESSION: Replacement of dislodged percutaneous gastrostomy tube with new 18 French balloon retention catheter. The tip of this catheter lies in the body of the stomach. Electronically Signed   By: Irish Lack M.D.   On: 09/10/2017 15:41   Dg Abd Portable 2v  Result Date: 09/10/2017 CLINICAL DATA:  Abdominal pain. EXAM: PORTABLE  ABDOMEN - 2 VIEW COMPARISON:  No recent. FINDINGS: Soft tissue structures are unremarkable. No bowel distention. Degenerative changes lumbar spine and both hips. No evidence of fracture or dislocation. Surgical clips right upper quadrant. IMPRESSION: No acute abnormality. Electronically Signed   By: Maisie Fus  Register   On: 09/10/2017 10:42     Scheduled Meds: . aspirin  325 mg Per Tube Daily  . atorvastatin  40 mg Oral q1800  . carvedilol  6.25 mg Per Tube BID WC  . chlorhexidine  15 mL Mouth Rinse BID  . feeding supplement (PRO-STAT SUGAR FREE 64)  30 mL Per Tube BID  . heparin  5,000 Units Subcutaneous Q8H  . insulin aspart  0-9 Units Subcutaneous Q4H  . insulin aspart  3 Units Subcutaneous Q4H  .  levETIRAcetam  1,000 mg Per Tube BID  . lisinopril  2.5 mg Per Tube Daily  . mouth rinse  15 mL Mouth Rinse q12n4p  . montelukast  10 mg Per Tube QHS   Continuous Infusions: . sodium chloride 50 mL/hr at 09/09/17 1535  . feeding supplement (JEVITY 1.2 CAL) 1,000 mL (09/08/17 1610)    Pamella Pert, MD, PhD [facility] Pager [phone]  If 7PM-7AM, please contact night-coverage [email or web address] Password [email or web address] 09/10/2017, 3:59 PM

## 2017-09-10 NOTE — Clinical Social Work Note (Signed)
SNF reached out to CSW and stated facility will not take pt because pt is more "LTACH" appropriate. However, LTACH told RNCM last week pt was more "SNF" appropriate. CSW reached out to [facility]. CSW continuing to follow for appropriate setting at d/c.   [place], [place] [phone]

## 2017-09-10 NOTE — Progress Notes (Signed)
Patient ID: Christine Franklin, female   DOB: 1950-06-29, 67 y.o.   MRN: [id number]   G tube was placed 06/07/17 in [facility] per daughter  Pt here at [facility] for sepsis from PNA Encephalopathy  From SNF [facility]  Asked to see pt secondary RN reports G tube seems to be "stickig out of abd"  Examined pt and found this to be true CT reveals IMPRESSION: Gastrostomy tube appears to have been pulled out and is completely external.  Discussed with Dr Fredia Sorrow Will attempt to replace/exchange in IR today  Dr Elvera Lennox aware   Discussed with pts daughter Christine Franklin via phone She consents for procedure

## 2017-09-10 NOTE — Care Management (Signed)
3:09 PM Update:  PT notes have been updated.  CSW actively working on placement.   1:21pm  CM spoke with attending regarding LTACH recommendation by PT (PTA pt resided in [facility])  Attending feels pt can safely discharge to SNF however was ok with CM providing referral to both [facility] and [facility].  Both facilities declined to offer bed to pt due to lack of intensity that would make pt appropriate for LTACH.  CM contacted pts daughter and explained lack of appropriateness for [facility] - daughter informed CM that the SNF in [place] requires updated PT note.   CM requested new PT assessment

## 2017-09-10 NOTE — Progress Notes (Signed)
Patient ID: Christine Franklin, female   DOB: 09/22/1950, 67 y.o.   MRN: [id number]   Called to evaluate G tube  G tube not placed at [facility] IR Not leaking per MD But RN having to tape tubing down- sticking out of abdomen  Bumper is away from skin Moved flush to skin Now only with 1 cm in abd  Discussed with Dr Fredia Sorrow Will get CT Abd w/o Cx for evaluation of position

## 2017-09-10 NOTE — Progress Notes (Addendum)
Physical Therapy Treatment Patient Details Name: Christine Franklin MRN: [id number] DOB: [birth year] Today's Date: 09/10/2017    History of Present Illness Christine Franklin comes to [facility] from [facility] on 4/25 after an event witnessed by patient's daughter, described as similar to typical tonic-cloinc seizure event. Event took place for 2 minutes, followed by sustained decrease in verbal responsiveness, which persists. The patient was in her "normal state of health" until January 2019, when she was found to be unresponsive while on her home CPAP.  She was hospitalized, requiring endotracheal intubation.  After liberation from mechanical ventilatory support, she failed her swallow evaluation, leading to PEG tube placement.  She was subsequently discharged to skilled nursing facilities and has been in a vicious cycle of recurrent UTIs, pneumonia and intubations.     PT Comments    Patient seen for activity progression. Tolerated increased time at EOB today but remains limited with ability to engage and participate in activity. Performed tone inhibitory techniques with patient, some progress noted. Spoke with daughter at length regarding concerns for OOB. Do not feel that it would be safe for patient to be OOB at this time given current extensor tone with trunk. Current POC remains appropriate.   Follow Up Recommendations  SNF.  Per conversation with Charlotte Crumb, PT she reports that MD states pt does not qualify for [facility] care therefore she changed recommendation to SNF for ongoing skilled PT (she is off this pm and does not have access to EMR, therefore entered by Lavona Mound, PT)     Equipment Recommendations  None recommended by PT    Recommendations for Other Services       Precautions / Restrictions Precautions Precautions: Fall Precaution Comments: Seizure; moisture wounds on buttock     Mobility  Bed Mobility Overal bed mobility: Needs Assistance Bed Mobility: Rolling;Sidelying to  Sit;Sit to Supine Rolling: Total assist;+2 for physical assistance;+2 for safety/equipment Sidelying to sit: Total assist;+2 for physical assistance;+2 for safety/equipment   Sit to supine: Total assist;+2 for physical assistance;+2 for safety/equipment   General bed mobility comments: totalA+2 for all mobility; pt sitting EOB >15 min during session, progressing to static sitting EOB with MinGuard assist   Transfers                    Ambulation/Gait                 Stairs             Wheelchair Mobility    Modified Rankin (Stroke Patients Only)       Balance Overall balance assessment: Needs assistance Sitting-balance support: Feet supported Sitting balance-Leahy Scale: Poor Sitting balance - Comments: initial max assist to break tone then min guard for several minutes EOB today ~15 min                                    Cognition Arousal/Alertness: Awake/alert Behavior During Therapy: Flat affect Overall Cognitive Status: Impaired/Different from baseline Area of Impairment: Attention;Following commands;Awareness                   Current Attention Level: Focused   Following Commands: Follows one step commands inconsistently   Awareness: Intellectual   General Comments: pt mumbling throughout session; often only responding "yes" to questions; pt noted to have significant (approx 7 second) delay when following commands, following some simple commands but inconsistently; pt becoming more  alert after sitting EOB for increased period of time       Exercises Other Exercises Other Exercises: EOB sitting balance activities with low level command task performance Other Exercises: dynamic trunk control activities at EOB Other Exercises: functional task performance with assist    General Comments General comments (skin integrity, edema, etc.): hygiene and pericare performed, noted PEG tube leakage nsg notified      Pertinent  Vitals/Pain Pain Assessment: Faces Faces Pain Scale: Hurts little more Pain Location: generalized, grimacing with some movements Pain Descriptors / Indicators: Grimacing Pain Intervention(s): Monitored during session    Home Living                      Prior Function            PT Goals (current goals can now be found in the care plan section) Acute Rehab PT Goals Patient Stated Goal: none stated  PT Goal Formulation: Patient unable to participate in goal setting Progress towards PT goals: (very modest progression with sitting balance)    Frequency    Min 3X/week      PT Plan Current plan remains appropriate    Co-evaluation              AM-PAC PT "6 Clicks" Daily Activity  Outcome Measure  Difficulty turning over in bed (including adjusting bedclothes, sheets and blankets)?: Unable Difficulty moving from lying on back to sitting on the side of the bed? : Unable Difficulty sitting down on and standing up from a chair with arms (e.g., wheelchair, bedside commode, etc,.)?: Unable Help needed moving to and from a bed to chair (including a wheelchair)?: Total Help needed walking in hospital room?: Total Help needed climbing 3-5 steps with a railing? : Total 6 Click Score: 6    End of Session   Activity Tolerance: Other (comment)(limited by cognitive deficits klimiting communication, as well as lglobal rigidity. ) Patient left: in bed;with call bell/phone within reach;with family/visitor present Nurse Communication: Mobility status PT Visit Diagnosis: Other symptoms and signs involving the nervous system (J19.147)     Time: 8295-6213 PT Time Calculation (min) (ACUTE ONLY): 25 min  Charges:  $Therapeutic Activity: 23-37 mins                    G Codes:       Charlotte Crumb, PT DPT  Board Certified Neurologic Specialist [phone],   [phone] 09/10/2017   Fabio Asa 09/10/2017, 10:33 AM

## 2017-09-11 LAB — GLUCOSE, CAPILLARY
GLUCOSE-CAPILLARY: 178 mg/dL — AB (ref 65–99)
GLUCOSE-CAPILLARY: 163 mg/dL — AB (ref 65–99)
Glucose-Capillary: 113 mg/dL — ABNORMAL HIGH (ref 65–99)
Glucose-Capillary: 132 mg/dL — ABNORMAL HIGH (ref 65–99)
Glucose-Capillary: 167 mg/dL — ABNORMAL HIGH (ref 65–99)
Glucose-Capillary: 171 mg/dL — ABNORMAL HIGH (ref 65–99)
Glucose-Capillary: 188 mg/dL — ABNORMAL HIGH (ref 65–99)

## 2017-09-11 MED ORDER — TRAMADOL HCL 50 MG PO TABS
25.0000 mg | ORAL_TABLET | Freq: Four times a day (QID) | ORAL | Status: DC | PRN
  Administered 2017-09-11 – 2017-09-12 (×2): 25 mg via ORAL
  Filled 2017-09-11 (×2): qty 1

## 2017-09-11 NOTE — Plan of Care (Signed)
Pt is calm and resting  

## 2017-09-11 NOTE — Progress Notes (Signed)
PROGRESS NOTE  Christine Franklin WUJ:[id number] DOB: [birth year] DOA: 09/02/2017 PCP: [name], MD   LOS: 9 days   Brief Narrative / Interim history: 67 year old female with past medical significant for DM, CAD, CKD stage 3, HLD, obesity who presents from [facility] with concern for subclinical status epilepticus.  The patient functioned independently prior to being originally hospitalized in January after being found unresponsive on her CPAP requiring intubation and treatment for pneumonia. Since that time she has been admitted and intubated twice being treated for sepsis related to pneumonia and UTI. Since January, she has continued to have this ongoing encephalopathy which waxes and wanes per her daughter, medical records note some concern for anoxic injury. She was working with PT/OT at [facility] and was nearing discharge.   The patient was inher baselinestate of health 4/25, herdaughter was visiting, whenthe patientyelled out, looked to her right and started generalized jerking for around 2 minutes. This was not witnessed by staff however patient remained altered from her baseline mental status and non-verbal. No recent known fever or other complaints. Her daughter states she is intermittently able to communicate and speak in sentences and follow commands at baseline; [facility] notes she is at baseline oriented to self, intermittently follows commands, and moves all extremities. Following event she was taken for head CT which was negative, transferred to [facility] ICU, started on Keppra 500 mg BID and EEG performed. Results of EEG unknown. She was transferred to [facility] for continuous EEG in concern for status epilepticus as patient has been unable to return to her baseline mental status. PCCM to admit.  She has had no witnessed seizure activity since admission to the hospital and continuous EEG monitoring showed no evidence of seizure activity. An MRI obtained 4/27 morning has  shown a left parietal subcortical infarct.   She was transferred to the hospitalists service pm 4/30.   Assessment & Plan: Principal Problem:   Acute encephalopathy Active Problems:   Generalized tonic-clonic seizure (HCC)   Postictal state (HCC)   Myoclonic jerking   Normocytic anemia   Cerebral thrombosis with cerebral infarction   Acute Stroke -patient is admitted from [facility] with encephalopathy, MRI on admission showed advanced chronic changes of atrophy and small vessel disease with superimposed acute left parietal subcortical white matter infarct.  Neurology was consulted and had followed patient while hospitalized.  She underwent a 2D echo which showed an EF of 45 to 50%, transcranial Doppler without significant findings however difficult study. Carotid dopplers limited due to patients ability to follow instrutions and positioning, but R carotid with "near normal" extracranial vessels.  Vertebrals with antegrade flow.  Normal flow in the R subclavian. A1c 7.8 Lipids LDL 42, HDL 36.  On aspirin for stroke prevention. Seizures -partial onset seizure secondary generalization.  Continue Keppra, appreciate neurology.  No further seizures Hypertension -Continue lisinopril, Coreg Chronic Encephalopathy -anoxic brain injury per chart review.  Appears close to baseline T2DM -q4 SSI and 3 units q4 with tube feeds PEG tube present / malfunction -PEG tube was placed in January 2018 at [facility].  They were concerned from the RN that the PEG tube is dislodged, interventional radiology consulted 5/3, evaluated patient, underwent a CT scan of the abdomen pelvis which showed that the PEG tube was out.  Successfully replaced and currently functioning well. Tube feeds per dietition CAD  -coreg, statin, asa, No chest pain Pressure injuries -not a pressure injury up with moisture related History of recurrent pneumonia and UTI  -no  si/sx of infection at this point.   Discharge to SNF, awaiting  insurance authorization, unsafe discharge outside SNF environment  DVT prophylaxis: heparin Code Status: Full code Family Communication: daughter present at bedside Disposition Plan: SNF  Consultants:   Neurology   Procedures:   2D echo Study Conclusions - Left ventricle: inferior basal hypokinesis. The cavity size was mildly dilated. Systolic function was mildly reduced. The estimated ejection fraction was in the range of 45% to 50%. Wall motion was normal; there were no regional wall motion abnormalities. The study is not technically sufficient to allow evaluation of LV diastolic function. - Mitral valve: There was mild regurgitation. - Atrial septum: No defect or patent foramen ovale was identified. Echo contrast study showed no right-to-left atrial level shunt, at baseline or with provocation.  Transcranial doppler Final Interpretation: Highly suboptimal and limited study due to absent bitemporal windows. Normal mean flow velocities in both vertebral, basilar, ophthalmic and left carotid siphon.  Carotid US Final Interpretation: Right Carotid: The extracranial vessels were near-normal with only minimal wall        thickening or plaque. Vertebrals: Bilateral vertebral arteries demonstrate antegrade flow. Subclavians: Normal flow hemodynamics were seen in the right subclavian artery.  Antimicrobials:  None    Subjective: - no chest pain, shortness of breath, no abdominal pain, nausea or vomiting.   Objective: Vitals:   09/10/17 2030 09/10/17 2319 09/11/17 0529 09/11/17 0857  BP: 133/72 (!) 148/84 (!) 141/90 (!) 168/89  Pulse: 86 87 73 68  Resp:  Temp: 99.3 F (37.4 C) 99.1 F (37.3 C) 98.8 F (37.1 C) 98.6 F (37 C)  TempSrc: Oral Oral Oral Oral  SpO2: 98% 100% 100% 100%  Weight:      Height:        Intake/Output Summary (Last 24 hours) at 09/11/2017 1229 Last data filed at 09/11/2017 0509 Gross per 24 hour  Intake 480 ml  Output 1100 ml    Net -620 ml   Filed Weights   09/07/17 0500 09/08/17 0418 09/09/17 1546  Weight: 106.6 kg (235 lb 0.2 oz) 112.8 kg (248 lb 10.9 oz) 113.4 kg (250 lb)    Examination:  Constitutional: no distress Respiratory: CTA Cardiovascular: RRR  Data Reviewed: I have independently reviewed following labs and imaging studies   CBC: Recent Labs  Lab 09/05/17 0649 09/07/17 0224 09/08/17 0653 09/09/17 0515  WBC 8.4 8.7 7.2 7.0  NEUTROABS 4.5  --   --   --   HGB 10.4* 10.3* 9.5* 9.2*  HCT 33.1* 32.5* 30.1* 29.4*  MCV 84.2 84.4 86.2 86.5  PLT 394 346 316 297   Basic Metabolic Panel: Recent Labs  Lab 09/05/17 0649 09/07/17 0224 09/08/17 0653 09/09/17 0515  NA 141 139 142 143  K 3.8 3.7 3.4* 3.5  CL 104 110 112* 112*  CO2 23 19* 24 25  GLUCOSE 159* 194* 162* 162*  BUN CREATININE 0.71 0.61 0.60 0.61  CALCIUM 9.5 9.0 8.9 8.8*  MG 1.5* 1.7 1.4*  --    GFR: Estimated Creatinine Clearance: 89.5 mL/min (by C-G formula based on SCr of 0.61 mg/dL). Liver Function Tests: Recent Labs  Lab 09/05/17 0649 09/08/17 0653  AST 16 11*  ALT 14 11*  ALKPHOS 93 70  BILITOT 0.9 0.4  PROT 6.7 5.9*  ALBUMIN 2.8* 2.7*   No results for input(s): LIPASE, AMYLASE in the last 168 hours. No results for input(s): AMMONIA in the last 168 hours.  Coagulation Profile: No results for input(s): INR, PROTIME in the last 168 hours. Cardiac Enzymes: No results for input(s): CKTOTAL, CKMB, CKMBINDEX, TROPONINI in the last 168 hours. BNP (last 3 results) No results for input(s): PROBNP in the last 8760 hours. HbA1C: No results for input(s): HGBA1C in the last 72 hours. CBG: Recent Labs  Lab 09/10/17 2027 09/11/17 0042 09/11/17 0412 09/11/17 0728 09/11/17 1131  GLUCAP 162* 167* 178* 171* 188*   Lipid Profile: No results for input(s): CHOL, HDL, LDLCALC, TRIG, CHOLHDL, LDLDIRECT in the last 72 hours. Thyroid Function Tests: No results for input(s): TSH, T4TOTAL, FREET4, T3FREE,  THYROIDAB in the last 72 hours. Anemia Panel: No results for input(s): VITAMINB12, FOLATE, FERRITIN, TIBC, IRON, RETICCTPCT in the last 72 hours. Urine analysis:    Component Value Date/Time   COLORURINE AMBER (A) 09/03/2017 0342   APPEARANCEUR CLOUDY (A) 09/03/2017 0342   LABSPEC 1.020 09/03/2017 0342   PHURINE 7.0 09/03/2017 0342   GLUCOSEU NEGATIVE 09/03/2017 0342   HGBUR NEGATIVE 09/03/2017 0342   BILIRUBINUR NEGATIVE 09/03/2017 0342   KETONESUR 5 (A) 09/03/2017 0342   PROTEINUR 100 (A) 09/03/2017 0342   NITRITE NEGATIVE 09/03/2017 0342   LEUKOCYTESUR LARGE (A) 09/03/2017 0342   Sepsis Labs: Invalid input(s): PROCALCITONIN, LACTICIDVEN  Recent Results (from the past 240 hour(s))  MRSA PCR Screening     Status: Abnormal   Collection Time: 09/02/17  9:10 PM  Result Value Ref Range Status   MRSA by PCR POSITIVE (A) NEGATIVE Final    Comment:        The GeneXpert MRSA Assay (FDA approved for NASAL specimens only), is one component of a comprehensive MRSA colonization surveillance program. It is not intended to diagnose MRSA infection nor to guide or monitor treatment for MRSA infections. RESULT CALLED TO, READ BACK BY AND VERIFIED WITH: G.DICKSON,RN AT 2339 BY L.PITT 09/02/17   Culture, blood (routine x 2)     Status: None   Collection Time: 09/02/17 10:49 PM  Result Value Ref Range Status   Specimen Description BLOOD LEFT HAND  Final   Special Requests   Final    BOTTLES DRAWN AEROBIC AND ANAEROBIC Blood Culture adequate volume   Culture   Final    NO GROWTH 5 DAYS Performed at [facility], [address] [place], [place] [address]    Report Status 09/07/2017 FINAL  Final  Culture, blood (routine x 2)     Status: None   Collection Time: 09/02/17 10:50 PM  Result Value Ref Range Status   Specimen Description BLOOD RIGHT HAND  Final   Special Requests   Final    BOTTLES DRAWN AEROBIC AND ANAEROBIC Blood Culture adequate volume   Culture   Final    NO  GROWTH 5 DAYS Performed at [facility], [address] [place], [place] [address]    Report Status 09/07/2017 FINAL  Final  Culture, Urine     Status: Abnormal   Collection Time: 09/04/17  6:39 PM  Result Value Ref Range Status   Specimen Description URINE, CATHETERIZED  Final   Special Requests   Final    Normal Performed at [facility], [address] [place], [place] [address]    Culture MULTIPLE SPECIES PRESENT, SUGGEST RECOLLECTION (A)  Final   Report Status 09/07/2017 FINAL  Final      Radiology Studies: Ct Abdomen Wo Contrast  Result Date: 09/10/2017 CLINICAL DATA:  Dysphagia, malposition gastrostomy tube. EXAM: CT ABDOMEN WITHOUT CONTRAST  TECHNIQUE: Multidetector CT imaging of the abdomen was performed following the standard protocol without IV contrast. COMPARISON:  Radiographs of same day. FINDINGS: Lower chest: No acute abnormality. Hepatobiliary: No focal liver abnormality is seen. Status post cholecystectomy. No biliary dilatation. Pancreas: Unremarkable. No pancreatic ductal dilatation or surrounding inflammatory changes. Spleen: Normal in size without focal abnormality. Adrenals/Urinary Tract: Adrenal glands appear normal. Multiple rounded hyperdense exophytic abnormalities are seen involving both kidneys most consistent with hyperdense cysts. No hydronephrosis or renal obstruction is noted. No renal or ureteral calculi are noted. Stomach/Bowel: Diverticulosis of the colon is noted without inflammation. There is no evidence of bowel obstruction. Gastrostomy tract is noted in the left upper quadrant abdominal wall, but the gastrostomy tube appears to be completely external. Vascular/Lymphatic: Aortic atherosclerosis. No enlarged abdominal lymph nodes. Other: No abdominal wall hernia is noted.  No free fluid is noted. Musculoskeletal: No acute or significant osseous findings. IMPRESSION: Gastrostomy tube appears to have been pulled out and is completely external.  Multiple rounded hyperdense exophytic abnormalities are seen involving both kidneys most consistent with hyperdense cysts, but ultrasound is recommended to rule out solid neoplasm. Electronically Signed   By: Lupita Raider, M.D.   On: 09/10/2017 12:58   Ir Replc Gastro/colonic Tube Percut W/fluoro  Result Date: 09/10/2017 INDICATION: Chronic indwelling percutaneous gastrostomy tube has become dislodged. EXAM: REPLACEMENT OF GASTROSTOMY TUBE UNDER FLUOROSCOPY MEDICATIONS: None ANESTHESIA/SEDATION: None CONTRAST:  10 mL Isovue-300 injected into the stomach FLUOROSCOPY TIME:  Fluoroscopy Time: 6 seconds.  4.6 mGy. COMPLICATIONS: None immediate. PROCEDURE: After inspected in the exit site of the previously placed and now completely dislodged percutaneous gastrostomy tube, an 29 French balloon retention gastrostomy tube was advanced. The retention balloon was inflated 8 mL of saline. The tube was injected with contrast material and a fluoroscopic spot image obtained and saved to confirm position. FINDINGS: A balloon retention catheter was able to be advanced through the pre-existing tract and into the gastric lumen. Contrast injection confirms intraluminal positioning of the catheter at the level of the body of the stomach. IMPRESSION: Replacement of dislodged percutaneous gastrostomy tube with new 18 French balloon retention catheter. The tip of this catheter lies in the body of the stomach. Electronically Signed   By: Irish Lack M.D.   On: 09/10/2017 15:41   Dg Abd Portable 2v  Result Date: 09/10/2017 CLINICAL DATA:  Abdominal pain. EXAM: PORTABLE ABDOMEN - 2 VIEW COMPARISON:  No recent. FINDINGS: Soft tissue structures are unremarkable. No bowel distention. Degenerative changes lumbar spine and both hips. No evidence of fracture or dislocation. Surgical clips right upper quadrant. IMPRESSION: No acute abnormality. Electronically Signed   By: Maisie Fus  Register   On: 09/10/2017 10:42     Scheduled  Meds: . aspirin  325 mg Per Tube Daily  . atorvastatin  40 mg Oral q1800  . carvedilol  6.25 mg Per Tube BID WC  . chlorhexidine  15 mL Mouth Rinse BID  . feeding supplement (PRO-STAT SUGAR FREE 64)  30 mL Per Tube BID  . heparin  5,000 Units Subcutaneous Q8H  . insulin aspart  0-9 Units Subcutaneous Q4H  . insulin aspart  3 Units Subcutaneous Q4H  . levETIRAcetam  1,000 mg Per Tube BID  . lisinopril  2.5 mg Per Tube Daily  . mouth rinse  15 mL Mouth Rinse q12n4p  . montelukast  10 mg Per Tube QHS   Continuous Infusions: . feeding supplement (JEVITY 1.2 CAL) 1,000 mL (09/11/17 0509)    Ming Mcmannis  Elvera Lennox, MD, PhD [facility] Pager [phone]  If 7PM-7AM, please contact night-coverage [email or web address] Password [facility] 09/11/2017, 12:29 PM

## 2017-09-11 NOTE — Progress Notes (Signed)
CSW following for discharge plan. CSW noting updated PT note from yesterday recommending SNF; CSW faxed updates to [facility]. Facility is not able to accept admissions over the weekend, and patient has not received insurance approval for SNF at this time. Patient's insurance will not authorize requests over the weekend, either. Patient cannot admit to SNF without prior approval from insurance company.   CSW updated MD. CSW to continue to follow.  Blenda Nicely, [place] Clinical Social Worker [phone]

## 2017-09-12 ENCOUNTER — Encounter (HOSPITAL_COMMUNITY): Payer: Self-pay | Admitting: *Deleted

## 2017-09-12 LAB — GLUCOSE, CAPILLARY
GLUCOSE-CAPILLARY: 153 mg/dL — AB (ref 65–99)
GLUCOSE-CAPILLARY: 167 mg/dL — AB (ref 65–99)
Glucose-Capillary: 185 mg/dL — ABNORMAL HIGH (ref 65–99)
Glucose-Capillary: 218 mg/dL — ABNORMAL HIGH (ref 65–99)
Glucose-Capillary: 150 mg/dL — ABNORMAL HIGH (ref 65–99)

## 2017-09-12 NOTE — Progress Notes (Signed)
PROGRESS NOTE  Christine Franklin ZOX:[id number] DOB: 09-Sep-1950 DOA: 09/02/2017 PCP: [name], MD   LOS: 10 days   Brief Narrative / Interim history: 67 year old female with past medical significant for DM, CAD, CKD stage 3, HLD, obesity who presents from [facility] with concern for subclinical status epilepticus.  The patient functioned independently prior to being originally hospitalized in January after being found unresponsive on her CPAP requiring intubation and treatment for pneumonia. Since that time she has been admitted and intubated twice being treated for sepsis related to pneumonia and UTI. Since January, she has continued to have this ongoing encephalopathy which waxes and wanes per her daughter, medical records note some concern for anoxic injury. She was working with PT/OT at [facility] and was nearing discharge.   The patient was inher baselinestate of health 4/25, herdaughter was visiting, whenthe patientyelled out, looked to her right and started generalized jerking for around 2 minutes. This was not witnessed by staff however patient remained altered from her baseline mental status and non-verbal. No recent known fever or other complaints. Her daughter states she is intermittently able to communicate and speak in sentences and follow commands at baseline; [facility] notes she is at baseline oriented to self, intermittently follows commands, and moves all extremities. Following event she was taken for head CT which was negative, transferred to [facility] ICU, started on Keppra 500 mg BID and EEG performed. Results of EEG unknown. She was transferred to [facility] for continuous EEG in concern for status epilepticus as patient has been unable to return to her baseline mental status. PCCM to admit.  She has had no witnessed seizure activity since admission to the hospital and continuous EEG monitoring showed no evidence of seizure activity. An MRI obtained 4/27 morning has  shown a left parietal subcortical infarct.   She was transferred to the hospitalists service pm 4/30.   Awaiting insurance authorization for SNF  Assessment & Plan: Principal Problem:   Acute encephalopathy Active Problems:   Generalized tonic-clonic seizure (HCC)   Postictal state (HCC)   Myoclonic jerking   Normocytic anemia   Cerebral thrombosis with cerebral infarction   Acute Stroke -patient is admitted from [facility] with encephalopathy, MRI on admission showed advanced chronic changes of atrophy and small vessel disease with superimposed acute left parietal subcortical white matter infarct.  Neurology was consulted and had followed patient while hospitalized.  She underwent a 2D echo which showed an EF of 45 to 50%, transcranial Doppler without significant findings however difficult study. Carotid dopplers limited due to patients ability to follow instrutions and positioning, but R carotid with "near normal" extracranial vessels.  Vertebrals with antegrade flow.  Normal flow in the R subclavian. A1c 7.8 Lipids LDL 42, HDL 36.  On aspirin for stroke prevention. Seizures -partial onset seizure secondary generalization.  Continue Keppra, appreciate neurology.  No further seizures Hypertension -Continue lisinopril, Coreg Chronic Encephalopathy -anoxic brain injury per chart review.  Appears close to baseline T2DM -q4 SSI and 3 units q4 with tube feeds PEG tube present / malfunction -PEG tube was placed in January 2018 at [facility].  They were concerned from the RN that the PEG tube is dislodged, interventional radiology consulted 5/3, evaluated patient, underwent a CT scan of the abdomen pelvis which showed that the PEG tube was out.  Successfully replaced and currently functioning well. Tube feeds per dietition CAD  -coreg, statin, asa, No chest pain Pressure injuries -not a pressure injury up with moisture related History of  recurrent pneumonia and UTI  -no si/sx of infection  at this point.   Discharge to SNF, awaiting insurance authorization, unsafe discharge outside SNF environment  DVT prophylaxis: heparin Code Status: Full code Family Communication: No family at bedside this morning Disposition Plan: SNF  Consultants:   Neurology   Procedures:   2D echo Study Conclusions - Left ventricle: inferior basal hypokinesis. The cavity size was mildly dilated. Systolic function was mildly reduced. The estimated ejection fraction was in the range of 45% to 50%. Wall motion was normal; there were no regional wall motion abnormalities. The study is not technically sufficient to allow evaluation of LV diastolic function. - Mitral valve: There was mild regurgitation. - Atrial septum: No defect or patent foramen ovale was identified. Echo contrast study showed no right-to-left atrial level shunt, at baseline or with provocation.  Transcranial doppler Final Interpretation: Highly suboptimal and limited study due to absent bitemporal windows. Normal mean flow velocities in both vertebral, basilar, ophthalmic and left carotid siphon.  Carotid US Final Interpretation: Right Carotid: The extracranial vessels were near-normal with only minimal wall        thickening or plaque. Vertebrals: Bilateral vertebral arteries demonstrate antegrade flow. Subclavians: Normal flow hemodynamics were seen in the right subclavian artery.  Antimicrobials:  None    Subjective: -No complaints, feeling well  Objective: Vitals:   09/11/17 1648 09/11/17 2009 09/12/17 0022 09/12/17 0418  BP: (!) 152/106 (!) 153/89 (!) 157/88 (!) 148/85  Pulse: 74 75 75 63  Resp: Temp:  99.3 F (37.4 C) 99 F (37.2 C) 98.7 F (37.1 C)  TempSrc:  Oral Oral Oral  SpO2:  98% 98% 99%  Weight:  104 kg (229 lb 4.5 oz)  104.1 kg (229 lb 8 oz)  Height:        Intake/Output Summary (Last 24 hours) at 09/12/2017 0944 Last data filed at 09/12/2017 0600 Gross per 24 hour  Intake  720 ml  Output 600 ml  Net 120 ml   Filed Weights   09/09/17 1546 09/11/17 2009 09/12/17 0418  Weight: 113.4 kg (250 lb) 104 kg (229 lb 4.5 oz) 104.1 kg (229 lb 8 oz)    Examination:  Constitutional: No distress Respiratory: Clear to auscultation Cardiovascular: Regular rate and rhythm  Data Reviewed: I have independently reviewed following labs and imaging studies   CBC: Recent Labs  Lab 09/07/17 0224 09/08/17 0653 09/09/17 0515  WBC 8.7 7.2 7.0  HGB 10.3* 9.5* 9.2*  HCT 32.5* 30.1* 29.4*  MCV 84.4 86.2 86.5  PLT 346 316 297   Basic Metabolic Panel: Recent Labs  Lab 09/07/17 0224 09/08/17 0653 09/09/17 0515  NA 139 142 143  K 3.7 3.4* 3.5  CL 110 112* 112*  CO2 19* 24 25  GLUCOSE 194* 162* 162*  BUN CREATININE 0.61 0.60 0.61  CALCIUM 9.0 8.9 8.8*  MG 1.7 1.4*  --    GFR: Estimated Creatinine Clearance: 85.4 mL/min (by C-G formula based on SCr of 0.61 mg/dL). Liver Function Tests: Recent Labs  Lab 09/08/17 0653  AST 11*  ALT 11*  ALKPHOS 70  BILITOT 0.4  PROT 5.9*  ALBUMIN 2.7*   No results for input(s): LIPASE, AMYLASE in the last 168 hours. No results for input(s): AMMONIA in the last 168 hours. Coagulation Profile: No results for input(s): INR, PROTIME in the last 168 hours. Cardiac Enzymes: No results for input(s): CKTOTAL, CKMB, CKMBINDEX, TROPONINI in the last 168 hours.  BNP (last 3 results) No results for input(s): PROBNP in the last 8760 hours. HbA1C: No results for input(s): HGBA1C in the last 72 hours. CBG: Recent Labs  Lab 09/11/17 1645 09/11/17 1944 09/11/17 2311 09/12/17 0350 09/12/17 0732  GLUCAP 113* 132* 163* 185* 153*   Lipid Profile: No results for input(s): CHOL, HDL, LDLCALC, TRIG, CHOLHDL, LDLDIRECT in the last 72 hours. Thyroid Function Tests: No results for input(s): TSH, T4TOTAL, FREET4, T3FREE, THYROIDAB in the last 72 hours. Anemia Panel: No results for input(s): VITAMINB12, FOLATE, FERRITIN, TIBC,  IRON, RETICCTPCT in the last 72 hours. Urine analysis:    Component Value Date/Time   COLORURINE AMBER (A) 09/03/2017 0342   APPEARANCEUR CLOUDY (A) 09/03/2017 0342   LABSPEC 1.020 09/03/2017 0342   PHURINE 7.0 09/03/2017 0342   GLUCOSEU NEGATIVE 09/03/2017 0342   HGBUR NEGATIVE 09/03/2017 0342   BILIRUBINUR NEGATIVE 09/03/2017 0342   KETONESUR 5 (A) 09/03/2017 0342   PROTEINUR 100 (A) 09/03/2017 0342   NITRITE NEGATIVE 09/03/2017 0342   LEUKOCYTESUR LARGE (A) 09/03/2017 0342   Sepsis Labs: Invalid input(s): PROCALCITONIN, LACTICIDVEN  Recent Results (from the past 240 hour(s))  MRSA PCR Screening     Status: Abnormal   Collection Time: 09/02/17  9:10 PM  Result Value Ref Range Status   MRSA by PCR POSITIVE (A) NEGATIVE Final    Comment:        The GeneXpert MRSA Assay (FDA approved for NASAL specimens only), is one component of a comprehensive MRSA colonization surveillance program. It is not intended to diagnose MRSA infection nor to guide or monitor treatment for MRSA infections. RESULT CALLED TO, READ BACK BY AND VERIFIED WITH: G.DICKSON,RN AT 2339 BY L.PITT 09/02/17   Culture, blood (routine x 2)     Status: None   Collection Time: 09/02/17 10:49 PM  Result Value Ref Range Status   Specimen Description BLOOD LEFT HAND  Final   Special Requests   Final    BOTTLES DRAWN AEROBIC AND ANAEROBIC Blood Culture adequate volume   Culture   Final    NO GROWTH 5 DAYS Performed at [facility], [address], [place], [place] [address]    Report Status 09/07/2017 FINAL  Final  Culture, blood (routine x 2)     Status: None   Collection Time: 09/02/17 10:50 PM  Result Value Ref Range Status   Specimen Description BLOOD RIGHT HAND  Final   Special Requests   Final    BOTTLES DRAWN AEROBIC AND ANAEROBIC Blood Culture adequate volume   Culture   Final    NO GROWTH 5 DAYS Performed at [facility], [address] [place], [place] [address]    Report Status  09/07/2017 FINAL  Final  Culture, Urine     Status: Abnormal   Collection Time: 09/04/17  6:39 PM  Result Value Ref Range Status   Specimen Description URINE, CATHETERIZED  Final   Special Requests   Final    Normal Performed at [facility], [address], [place], [place] [address]    Culture MULTIPLE SPECIES PRESENT, SUGGEST RECOLLECTION (A)  Final   Report Status 09/07/2017 FINAL  Final      Radiology Studies: Ct Abdomen Wo Contrast  Result Date: 09/10/2017 CLINICAL DATA:  Dysphagia, malposition gastrostomy tube. EXAM: CT ABDOMEN WITHOUT CONTRAST TECHNIQUE: Multidetector CT imaging of the abdomen was performed following the standard protocol without IV contrast. COMPARISON:  Radiographs of same day. FINDINGS: Lower chest: No acute abnormality.  Hepatobiliary: No focal liver abnormality is seen. Status post cholecystectomy. No biliary dilatation. Pancreas: Unremarkable. No pancreatic ductal dilatation or surrounding inflammatory changes. Spleen: Normal in size without focal abnormality. Adrenals/Urinary Tract: Adrenal glands appear normal. Multiple rounded hyperdense exophytic abnormalities are seen involving both kidneys most consistent with hyperdense cysts. No hydronephrosis or renal obstruction is noted. No renal or ureteral calculi are noted. Stomach/Bowel: Diverticulosis of the colon is noted without inflammation. There is no evidence of bowel obstruction. Gastrostomy tract is noted in the left upper quadrant abdominal wall, but the gastrostomy tube appears to be completely external. Vascular/Lymphatic: Aortic atherosclerosis. No enlarged abdominal lymph nodes. Other: No abdominal wall hernia is noted.  No free fluid is noted. Musculoskeletal: No acute or significant osseous findings. IMPRESSION: Gastrostomy tube appears to have been pulled out and is completely external. Multiple rounded hyperdense exophytic abnormalities are seen involving both kidneys most consistent with hyperdense  cysts, but ultrasound is recommended to rule out solid neoplasm. Electronically Signed   By: Lupita Raider, M.D.   On: 09/10/2017 12:58   Ir Replc Gastro/colonic Tube Percut W/fluoro  Result Date: 09/10/2017 INDICATION: Chronic indwelling percutaneous gastrostomy tube has become dislodged. EXAM: REPLACEMENT OF GASTROSTOMY TUBE UNDER FLUOROSCOPY MEDICATIONS: None ANESTHESIA/SEDATION: None CONTRAST:  10 mL Isovue-300 injected into the stomach FLUOROSCOPY TIME:  Fluoroscopy Time: 6 seconds.  4.6 mGy. COMPLICATIONS: None immediate. PROCEDURE: After inspected in the exit site of the previously placed and now completely dislodged percutaneous gastrostomy tube, an 54 French balloon retention gastrostomy tube was advanced. The retention balloon was inflated 8 mL of saline. The tube was injected with contrast material and a fluoroscopic spot image obtained and saved to confirm position. FINDINGS: A balloon retention catheter was able to be advanced through the pre-existing tract and into the gastric lumen. Contrast injection confirms intraluminal positioning of the catheter at the level of the body of the stomach. IMPRESSION: Replacement of dislodged percutaneous gastrostomy tube with new 18 French balloon retention catheter. The tip of this catheter lies in the body of the stomach. Electronically Signed   By: Irish Lack M.D.   On: 09/10/2017 15:41   Dg Abd Portable 2v  Result Date: 09/10/2017 CLINICAL DATA:  Abdominal pain. EXAM: PORTABLE ABDOMEN - 2 VIEW COMPARISON:  No recent. FINDINGS: Soft tissue structures are unremarkable. No bowel distention. Degenerative changes lumbar spine and both hips. No evidence of fracture or dislocation. Surgical clips right upper quadrant. IMPRESSION: No acute abnormality. Electronically Signed   By: Maisie Fus  Register   On: 09/10/2017 10:42     Scheduled Meds: . aspirin  325 mg Per Tube Daily  . atorvastatin  40 mg Oral q1800  . carvedilol  6.25 mg Per Tube BID WC  .  chlorhexidine  15 mL Mouth Rinse BID  . feeding supplement (PRO-STAT SUGAR FREE 64)  30 mL Per Tube BID  . heparin  5,000 Units Subcutaneous Q8H  . insulin aspart  0-9 Units Subcutaneous Q4H  . insulin aspart  3 Units Subcutaneous Q4H  . levETIRAcetam  1,000 mg Per Tube BID  . lisinopril  2.5 mg Per Tube Daily  . mouth rinse  15 mL Mouth Rinse q12n4p  . montelukast  10 mg Per Tube QHS   Continuous Infusions: . feeding supplement (JEVITY 1.2 CAL) 1,000 mL (09/12/17 0310)    Pamella Pert, MD, PhD [facility] Pager [phone]  If 7PM-7AM, please contact night-coverage [email or web address] Password [email or web address] 09/12/2017, 9:44 AM

## 2017-09-13 LAB — GLUCOSE, CAPILLARY
GLUCOSE-CAPILLARY: 129 mg/dL — AB (ref 65–99)
GLUCOSE-CAPILLARY: 166 mg/dL — AB (ref 65–99)
Glucose-Capillary: 184 mg/dL — ABNORMAL HIGH (ref 65–99)
Glucose-Capillary: 168 mg/dL — ABNORMAL HIGH (ref 65–99)
Glucose-Capillary: 188 mg/dL — ABNORMAL HIGH (ref 65–99)
Glucose-Capillary: 192 mg/dL — ABNORMAL HIGH (ref 65–99)
Glucose-Capillary: 143 mg/dL — ABNORMAL HIGH (ref 65–99)

## 2017-09-13 NOTE — Plan of Care (Signed)
  Problem: Clinical Measurements: Goal: Respiratory complications will improve Outcome: Progressing   

## 2017-09-13 NOTE — Progress Notes (Signed)
PROGRESS NOTE  Christine Franklin ZOX:[id number] DOB: Mar 09, 1951 DOA: 09/02/2017 PCP: [name], MD   LOS: 11 days   Brief Narrative / Interim history: 67 year old female with past medical significant for DM, CAD, CKD stage 3, HLD, obesity who presents from [facility] with concern for subclinical status epilepticus.  The patient functioned independently prior to being originally hospitalized in January after being found unresponsive on her CPAP requiring intubation and treatment for pneumonia. Since that time she has been admitted and intubated twice being treated for sepsis related to pneumonia and UTI. Since January, she has continued to have this ongoing encephalopathy which waxes and wanes per her daughter, medical records note some concern for anoxic injury. She was working with PT/OT at [facility] and was nearing discharge.   The patient was inher baselinestate of health 4/25, herdaughter was visiting, whenthe patientyelled out, looked to her right and started generalized jerking for around 2 minutes. This was not witnessed by staff however patient remained altered from her baseline mental status and non-verbal. No recent known fever or other complaints. Her daughter states she is intermittently able to communicate and speak in sentences and follow commands at baseline; [facility] notes she is at baseline oriented to self, intermittently follows commands, and moves all extremities. Following event she was taken for head CT which was negative, transferred to [facility] ICU, started on Keppra 500 mg BID and EEG performed. Results of EEG unknown. She was transferred to [facility] for continuous EEG in concern for status epilepticus as patient has been unable to return to her baseline mental status. PCCM to admit.  She has had no witnessed seizure activity since admission to the hospital and continuous EEG monitoring showed no evidence of seizure activity. An MRI obtained 4/27 morning has  shown a left parietal subcortical infarct.   She was transferred to the hospitalists service pm 4/30.   Awaiting insurance authorization for SNF  Assessment & Plan: Principal Problem:   Acute encephalopathy Active Problems:   Generalized tonic-clonic seizure (HCC)   Postictal state (HCC)   Myoclonic jerking   Normocytic anemia   Cerebral thrombosis with cerebral infarction   Acute Stroke -patient is admitted from [facility] with encephalopathy, MRI on admission showed advanced chronic changes of atrophy and small vessel disease with superimposed acute left parietal subcortical white matter infarct.  Neurology was consulted and had followed patient while hospitalized.  She underwent a 2D echo which showed an EF of 45 to 50%, transcranial Doppler without significant findings however difficult study. Carotid dopplers limited due to patients ability to follow instrutions and positioning, but R carotid with "near normal" extracranial vessels.  Vertebrals with antegrade flow.  Normal flow in the R subclavian. A1c 7.8 Lipids LDL 42, HDL 36.  On aspirin for stroke prevention. Seizures -partial onset seizure secondary generalization.  Continue Keppra, appreciate neurology.  No further seizures Hypertension -Continue lisinopril, Coreg Chronic Encephalopathy -anoxic brain injury per chart review.  Appears close to baseline T2DM -q4 SSI and 3 units q4 with tube feeds PEG tube present / malfunction -PEG tube was placed in January 2018 at [facility].  They were concerned from the RN that the PEG tube is dislodged, interventional radiology consulted 5/3, evaluated patient, underwent a CT scan of the abdomen pelvis which showed that the PEG tube was out.  Successfully replaced and currently functioning well. Tube feeds per dietition CAD  -coreg, statin, asa, No chest pain Pressure injuries -not a pressure injury up with moisture related History of  recurrent pneumonia and UTI  -no si/sx of infection  at this point.   Discharge to SNF, awaiting insurance authorization, unsafe discharge outside SNF environment  DVT prophylaxis: heparin Code Status: Full code Family Communication: No family at bedside this morning Disposition Plan: SNF  Consultants:   Neurology   Procedures:   2D echo Study Conclusions - Left ventricle: inferior basal hypokinesis. The cavity size was mildly dilated. Systolic function was mildly reduced. The estimated ejection fraction was in the range of 45% to 50%. Wall motion was normal; there were no regional wall motion abnormalities. The study is not technically sufficient to allow evaluation of LV diastolic function. - Mitral valve: There was mild regurgitation. - Atrial septum: No defect or patent foramen ovale was identified. Echo contrast study showed no right-to-left atrial level shunt, at baseline or with provocation.  Transcranial doppler Final Interpretation: Highly suboptimal and limited study due to absent bitemporal windows. Normal mean flow velocities in both vertebral, basilar, ophthalmic and left carotid siphon.  Carotid US Final Interpretation: Right Carotid: The extracranial vessels were near-normal with only minimal wall        thickening or plaque. Vertebrals: Bilateral vertebral arteries demonstrate antegrade flow. Subclavians: Normal flow hemodynamics were seen in the right subclavian artery.  Antimicrobials:  None    Subjective: -no complaints  Objective: Vitals:   09/13/17 0300 09/13/17 0451 09/13/17 0919 09/13/17 1008  BP: 128/82  (!) 154/83 125/75  Pulse: 89  80 78  Resp: 20  17   Temp:      TempSrc:      SpO2: 98%  98%   Weight:  109.8 kg (242 lb)    Height:       No intake or output data in the 24 hours ending 09/13/17 1619 Filed Weights   09/11/17 2009 09/12/17 0418 09/13/17 0451  Weight: 104 kg (229 lb 4.5 oz) 104.1 kg (229 lb 8 oz) 109.8 kg (242 lb)    Examination:  Constitutional: NAD Respiratory:  CTA Cardiovascular: RRR  Data Reviewed: I have independently reviewed following labs and imaging studies   CBC: Recent Labs  Lab 09/07/17 0224 09/08/17 0653 09/09/17 0515  WBC 8.7 7.2 7.0  HGB 10.3* 9.5* 9.2*  HCT 32.5* 30.1* 29.4*  MCV 84.4 86.2 86.5  PLT 346 316 297   Basic Metabolic Panel: Recent Labs  Lab 09/07/17 0224 09/08/17 0653 09/09/17 0515  NA 139 142 143  K 3.7 3.4* 3.5  CL 110 112* 112*  CO2 19* 24 25  GLUCOSE 194* 162* 162*  BUN CREATININE 0.61 0.60 0.61  CALCIUM 9.0 8.9 8.8*  MG 1.7 1.4*  --    GFR: Estimated Creatinine Clearance: 88 mL/min (by C-G formula based on SCr of 0.61 mg/dL). Liver Function Tests: Recent Labs  Lab 09/08/17 0653  AST 11*  ALT 11*  ALKPHOS 70  BILITOT 0.4  PROT 5.9*  ALBUMIN 2.7*   No results for input(s): LIPASE, AMYLASE in the last 168 hours. No results for input(s): AMMONIA in the last 168 hours. Coagulation Profile: No results for input(s): INR, PROTIME in the last 168 hours. Cardiac Enzymes: No results for input(s): CKTOTAL, CKMB, CKMBINDEX, TROPONINI in the last 168 hours. BNP (last 3 results) No results for input(s): PROBNP in the last 8760 hours. HbA1C: No results for input(s): HGBA1C in the last 72 hours. CBG: Recent Labs  Lab 09/12/17 2357 09/13/17 0458 09/13/17 0745 09/13/17 1130 09/13/17 1546  GLUCAP 188* 184* 168* 192* 143*  Lipid Profile: No results for input(s): CHOL, HDL, LDLCALC, TRIG, CHOLHDL, LDLDIRECT in the last 72 hours. Thyroid Function Tests: No results for input(s): TSH, T4TOTAL, FREET4, T3FREE, THYROIDAB in the last 72 hours. Anemia Panel: No results for input(s): VITAMINB12, FOLATE, FERRITIN, TIBC, IRON, RETICCTPCT in the last 72 hours. Urine analysis:    Component Value Date/Time   COLORURINE AMBER (A) 09/03/2017 0342   APPEARANCEUR CLOUDY (A) 09/03/2017 0342   LABSPEC 1.020 09/03/2017 0342   PHURINE 7.0 09/03/2017 0342   GLUCOSEU NEGATIVE 09/03/2017 0342    HGBUR NEGATIVE 09/03/2017 0342   BILIRUBINUR NEGATIVE 09/03/2017 0342   KETONESUR 5 (A) 09/03/2017 0342   PROTEINUR 100 (A) 09/03/2017 0342   NITRITE NEGATIVE 09/03/2017 0342   LEUKOCYTESUR LARGE (A) 09/03/2017 0342   Sepsis Labs: Invalid input(s): PROCALCITONIN, LACTICIDVEN  Recent Results (from the past 240 hour(s))  Culture, Urine     Status: Abnormal   Collection Time: 09/04/17  6:39 PM  Result Value Ref Range Status   Specimen Description URINE, CATHETERIZED  Final   Special Requests   Final    Normal Performed at [facility], [address] [place], [place] [address]    Culture MULTIPLE SPECIES PRESENT, SUGGEST RECOLLECTION (A)  Final   Report Status 09/07/2017 FINAL  Final      Radiology Studies: No results found.   Scheduled Meds: . aspirin  325 mg Per Tube Daily  . atorvastatin  40 mg Oral q1800  . carvedilol  6.25 mg Per Tube BID WC  . chlorhexidine  15 mL Mouth Rinse BID  . feeding supplement (PRO-STAT SUGAR FREE 64)  30 mL Per Tube BID  . heparin  5,000 Units Subcutaneous Q8H  . insulin aspart  0-9 Units Subcutaneous Q4H  . insulin aspart  3 Units Subcutaneous Q4H  . levETIRAcetam  1,000 mg Per Tube BID  . lisinopril  2.5 mg Per Tube Daily  . mouth rinse  15 mL Mouth Rinse q12n4p  . montelukast  10 mg Per Tube QHS   Continuous Infusions: . feeding supplement (JEVITY 1.2 CAL) 1,000 mL (09/12/17 0310)    Pamella Pert, MD, PhD [facility] Pager [phone]  If 7PM-7AM, please contact night-coverage [email or web address] Password [email or web address] 09/13/2017, 4:19 PM

## 2017-09-13 NOTE — Progress Notes (Signed)
CSW following for discharge plan. CSW contacted [facility] admissions earlier today and left voicemail to ensure that they had everything that they needed in order to process patient's admission to SNF and facilitate insurance authorization request. CSW left voicemail for Admissions. CSW contacted patient's daughter to provide update on progress. Patient's daughter expressed that she would also reach out to [facility] rehab to make sure someone was working on getting her mom to the facility.  CSW never received call back, so called again this afternoon and left another voicemail for Admissions office. CSW has not received call back.   CSW will continue to follow.  Blenda Nicely, [place] Clinical Social Worker [phone]

## 2017-09-13 NOTE — Progress Notes (Signed)
Physical Therapy Treatment Patient Details Name: Christine Franklin MRN: [id number] DOB: 27-Mar-1951 Today's Date: 09/13/2017    History of Present Illness Christine Franklin comes to [facility] from [facility] on 4/25 after an event witnessed by patient's daughter, described as similar to typical tonic-cloinc seizure event. Event took place for 2 minutes, followed by sustained decrease in verbal responsiveness, which persists. The patient was in her "normal state of health" until January 2019, when she was found to be unresponsive while on her home CPAP.  She was hospitalized, requiring endotracheal intubation.  After liberation from mechanical ventilatory support, she failed her swallow evaluation, leading to PEG tube placement.  She was subsequently discharged to skilled nursing facilities and has been in a vicious cycle of recurrent UTIs, pneumonia and intubations.     PT Comments    Patient continues to demonstrate impaired cognition and communication. Pt required total A +2 for bed mobility due to rigidity and able to sit EOB briefly this session due to increased retropulsion. Continue to progress as tolerated with anticipated d/c to SNF for further skilled PT services.      Follow Up Recommendations  SNF;Supervision/Assistance - 24 hour     Equipment Recommendations  None recommended by PT    Recommendations for Other Services       Precautions / Restrictions Precautions Precautions: Fall Precaution Comments: Seizure; moisture wounds on buttock  Restrictions Weight Bearing Restrictions: No    Mobility  Bed Mobility Overal bed mobility: Needs Assistance Bed Mobility: Supine to Sit;Sit to Supine     Supine to sit: Total assist;+2 for physical assistance Sit to supine: Total assist;+2 for physical assistance   General bed mobility comments: total A for all mobility; limited time sitting EOB due to heavy retropulsion  Transfers                    Ambulation/Gait                 Stairs             Wheelchair Mobility    Modified Rankin (Stroke Patients Only)       Balance Overall balance assessment: Needs assistance Sitting-balance support: Feet supported Sitting balance-Leahy Scale: Poor Sitting balance - Comments: max A to break tone in bilat LE EOB however pt with increasing posterior bias and unable to maintain sitting EOB  Postural control: Posterior lean                                  Cognition Arousal/Alertness: Awake/alert Behavior During Therapy: Flat affect Overall Cognitive Status: Impaired/Different from baseline Area of Impairment: Attention;Following commands;Awareness                   Current Attention Level: Focused   Following Commands: Follows one step commands inconsistently   Awareness: Intellectual          Exercises      General Comments        Pertinent Vitals/Pain Pain Assessment: Faces Faces Pain Scale: Hurts little more Pain Location: generalized, grimacing with some movements Pain Descriptors / Indicators: Grimacing Pain Intervention(s): Limited activity within patient's tolerance;Monitored during session;Repositioned    Home Living                      Prior Function            PT Goals (current goals can now  be found in the care plan section) Acute Rehab PT Goals Patient Stated Goal: none stated  Progress towards PT goals: Not progressing toward goals - comment    Frequency    Min 3X/week      PT Plan Current plan remains appropriate    Co-evaluation              AM-PAC PT "6 Clicks" Daily Activity  Outcome Measure  Difficulty turning over in bed (including adjusting bedclothes, sheets and blankets)?: Unable Difficulty moving from lying on back to sitting on the side of the bed? : Unable Difficulty sitting down on and standing up from a chair with arms (e.g., wheelchair, bedside commode, etc,.)?: Unable Help needed moving to and from  a bed to chair (including a wheelchair)?: Total Help needed walking in hospital room?: Total Help needed climbing 3-5 steps with a railing? : Total 6 Click Score: 6    End of Session   Activity Tolerance: Other (comment)(limited by cognitive deficits/communication, as well as lglobal rigidity. ) Patient left: in bed;with call bell/phone within reach;with SCD's reapplied Nurse Communication: Mobility status PT Visit Diagnosis: Other symptoms and signs involving the nervous system (B14.782)     Time: 9562-1308 PT Time Calculation (min) (ACUTE ONLY): 24 min  Charges:  $Therapeutic Activity: 23-37 mins                    G Codes:       Erline Levine, PTA Pager: [phone]     Carolynne Edouard 09/13/2017, 4:29 PM

## 2017-09-14 LAB — GLUCOSE, CAPILLARY
GLUCOSE-CAPILLARY: 213 mg/dL — AB (ref 65–99)
Glucose-Capillary: 199 mg/dL — ABNORMAL HIGH (ref 65–99)
Glucose-Capillary: 201 mg/dL — ABNORMAL HIGH (ref 65–99)

## 2017-09-14 MED ORDER — LEVETIRACETAM 100 MG/ML PO SOLN: 1000.0000 mg | Freq: Two times a day (BID) | ORAL | 12 refills | Status: AC

## 2017-09-14 MED ORDER — LISINOPRIL 2.5 MG PO TABS: 2.5000 mg | ORAL_TABLET | Freq: Every day | ORAL | Status: DC

## 2017-09-14 MED ORDER — ASPIRIN 325 MG PO TABS: 325.0000 mg | ORAL_TABLET | Freq: Every day | ORAL | Status: DC

## 2017-09-14 MED ORDER — CARVEDILOL 6.25 MG PO TABS: 6.2500 mg | ORAL_TABLET | Freq: Two times a day (BID) | ORAL | Status: AC

## 2017-09-14 MED ORDER — ATORVASTATIN CALCIUM 40 MG PO TABS: 40.0000 mg | ORAL_TABLET | Freq: Every day | ORAL | Status: AC

## 2017-09-14 NOTE — Plan of Care (Signed)
  Problem: Education: Goal: Knowledge of patient specific risk factors addressed and post discharge goals established will improve Outcome: Progressing  This is progressing with the help of daughter

## 2017-09-14 NOTE — Clinical Social Work Placement (Addendum)
   CLINICAL SOCIAL WORK PLACEMENT  NOTE 09/14/17 - DISCHARGED TO [facility] IN [place], [place] VIA AMBULANCE  Date:  09/14/2017  Patient Details  Name: Christine Franklin MRN: [id number] Date of Birth: [birth year]  Clinical Social Work is seeking post-discharge placement for this patient at the Skilled  Nursing Facility level of care (*CSW will initial, date and re-position this form in  chart as items are completed):  No   Patient/family provided with [facility] Clinical Social Work Department's list of facilities offering this level of care within the geographic area requested by the patient (or if unable, by the patient's family).  Yes   Patient/family informed of their freedom to choose among providers that offer the needed level of care, that participate in Medicare, Medicaid or managed care program needed by the patient, have an available bed and are willing to accept the patient.  No   Patient/family informed of [facility]'s ownership interest in [facility] and [facility], as well as of the fact that they are under no obligation to receive care at these facilities.  PASRR submitted to EDS on (PASRR number not needed as daughter requested facility in [place], [place])     [place] number received on       Existing PASRR number confirmed on       FL2 transmitted to all facilities in geographic area requested by pt/family on 09/09/17     FL2 transmitted to all facilities within larger geographic area on       Patient informed that his/her managed care company has contracts with or will negotiate with certain facilities, including the following:        Yes   Patient/family informed of bed offers received.  Patient/family chooses bed at  [facility] in [place], [place]     Physician recommends and patient chooses bed at      Patient to be transferred to  [facility] on 09/14/17.  Patient to be transferred to facility by Ambulance     Patient family  notified on 09/14/17 of transfer.  Name of family member notified:  Daughter Christine Franklin [phone])     PHYSICIAN       Additional Comment:    _______________________________________________ Cristobal Goldmann, LCSW 09/14/2017, 5:21 PM

## 2017-09-14 NOTE — Discharge Summary (Signed)
Physician Discharge Summary  Christine Franklin:[id number] DOB: [birth year] DOA: 09/02/2017  PCP: Christine Plum, MD  Admit date: 09/02/2017 Discharge date: 09/14/2017  Admitted From: [facility] Disposition:  SNF  Recommendations for Outpatient Follow-up:  1. Follow up with PCP in 1-2 weeks  Home Health: none Equipment/Devices: none  Discharge Condition: stable CODE STATUS: Full code Diet recommendation: tube feeding, NPO  HPI: Per [name], 67 year old African-American female who transferred from [facility] after a witnessed generalized tonic-clonic seizure-like event.  EEG was performed at [facility], suggestive of myoclonic jerks.  Transferred for continuous EEG monitoring to rule out subclinical status.  Apparently conversant at baseline.  Currently mumbling one-word, abnormal mastication reflex, some stereotypical almost (almost pill-rolling tremor like) movements of the hands and occasional myoclonus.  Postictal state?  Clinical status epilepticus?  Case discussed with Dr. Otelia Franklin. The patient was in her "normal state of health" until January 2019, when she was found to be unresponsive while on her home CPAP.  She was hospitalized, requiring endotracheal intubation.  After liberation from mechanical ventilatory support, she failed her swallow evaluation, leading to PEG tube placement.  She was subsequently discharged to skilled nursing facilities and has been in a vicious cycle of recurrent UTIs, pneumonia and intubations.  Keep head of bed 30+ degrees at all times.  May need evaluation for reflux.  Hospital Course: Acute Stroke -patient is admitted from [facility] with encephalopathy, MRI on admission showed advanced chronic changes of atrophy and small vessel disease with superimposed acute left parietal subcortical white matter infarct.  Neurology was consulted and had followed patient while hospitalized.  She underwent a 2D echo which showed an EF of 45 to 50%, transcranial Doppler  without significant findings however difficult study. Carotid dopplers limited due to patients ability to follow instrutions and positioning, but R carotid with "near normal" extracranial vessels. Vertebrals with antegrade flow. Normal flow in the R subclavian. A1c 7.8 Lipids LDL 42, HDL 36.  On aspirin for stroke prevention. Seizures -partial onset seizure secondary generalization.  Continue Keppra, appreciate neurology.  No further seizures Hypertension -Continue lisinopril, Coreg Chronic Encephalopathy -anoxic brain injury per chart review.  Appears close to baseline T2DM -continue insulin PEG tube present / malfunction -PEG tube was placed in January 2018 at [facility]  PEG tube got dislodged during this hospitalization, interventional radiology consulted 5/3, evaluated patient, underwent a CT scan of the abdomen pelvis which showed that the PEG tube was out.  Successfully replaced and currently functioning well. Tube feeds per dietition CAD -coreg, statin, asa, No chest pain Pressure injuries -not a pressure injury up with moisture related History of recurrent pneumonia and UTI -no si/sx of infection at this point.    Discharge Diagnoses:  Principal Problem:   Acute encephalopathy Active Problems:   Generalized tonic-clonic seizure (HCC)   Postictal state (HCC)   Myoclonic jerking   Normocytic anemia   Cerebral thrombosis with cerebral infarction     Discharge Instructions   Allergies as of 09/14/2017      Reactions   Ace Inhibitors    Contrast Media [iodinated Diagnostic Agents]    Dust Mite Extract    Grapefruit Extract    Lambs Quarters    Penicillins    Shellfish Allergy       Medication List    STOP taking these medications   levETIRAcetam 500 MG tablet Commonly known as:  KEPPRA Replaced by:  levETIRAcetam 100 MG/ML solution     TAKE these medications   acetaminophen 325  MG tablet Commonly known as:  TYLENOL Take 650 mg by mouth every 6 (six) hours as  needed for moderate pain.   aspirin 325 MG tablet Place 1 tablet (325 mg total) into feeding tube daily. Start taking on:  09/15/2017   atorvastatin 40 MG tablet Commonly known as:  LIPITOR Take 1 tablet (40 mg total) by mouth daily at 6 PM. What changed:    medication strength  how much to take   azelastine 0.1 % nasal spray Commonly known as:  ASTELIN Place 2 sprays into the nose 2 (two) times daily.   carvedilol 6.25 MG tablet Commonly known as:  COREG Place 1 tablet (6.25 mg total) into feeding tube 2 (two) times daily with a meal. What changed:    medication strength  how much to take  how to take this   COMBIVENT IN Inhale 1 puff into the lungs every 4 (four) hours.   docusate sodium 100 MG capsule Commonly known as:  COLACE Take 100 mg by mouth 2 (two) times daily.   feeding supplement (JEVITY 1.5 CAL) Liqd Place 1,000 mLs into feeding tube continuous.   gabapentin 100 MG capsule Commonly known as:  NEURONTIN Take 100 mg by mouth at bedtime.   insulin detemir 100 UNIT/ML injection Commonly known as:  LEVEMIR Inject 30 Units into the skin at bedtime.   levETIRAcetam 100 MG/ML solution Commonly known as:  KEPPRA Place 10 mLs (1,000 mg total) into feeding tube 2 (two) times daily. Replaces:  levETIRAcetam 500 MG tablet   lisinopril 2.5 MG tablet Commonly known as:  PRINIVIL,ZESTRIL Place 1 tablet (2.5 mg total) into feeding tube daily. Start taking on:  09/15/2017   magnesium oxide 400 MG tablet Commonly known as:  MAG-OX Take 400 mg by mouth 2 (two) times daily.   montelukast 10 MG tablet Commonly known as:  SINGULAIR Take 10 mg by mouth at bedtime.   omega-3 acid ethyl esters 1 g capsule Commonly known as:  LOVAZA Take 1 g by mouth 2 (two) times daily.        Consultations:  Neurology   Procedures/Studies:  2D echo  Study Conclusions - Left ventricle: inferior basal hypokinesis. The cavity size was mildly dilated. Systolic function  was mildly reduced. The estimated ejection fraction was in the range of 45% to 50%. Wall motion was normal; there were no regional wall motion abnormalities. The study is not technically sufficient to allow evaluation of LV diastolic function. - Mitral valve: There was mild regurgitation. - Atrial septum: No defect or patent foramen ovale was identified. Echo contrast study showed no right-to-left atrial level shunt, at baseline or with provocation.  Ct Abdomen Wo Contrast  Result Date: 09/10/2017 CLINICAL DATA:  Dysphagia, malposition gastrostomy tube. EXAM: CT ABDOMEN WITHOUT CONTRAST TECHNIQUE: Multidetector CT imaging of the abdomen was performed following the standard protocol without IV contrast. COMPARISON:  Radiographs of same day. FINDINGS: Lower chest: No acute abnormality. Hepatobiliary: No focal liver abnormality is seen. Status post cholecystectomy. No biliary dilatation. Pancreas: Unremarkable. No pancreatic ductal dilatation or surrounding inflammatory changes. Spleen: Normal in size without focal abnormality. Adrenals/Urinary Tract: Adrenal glands appear normal. Multiple rounded hyperdense exophytic abnormalities are seen involving both kidneys most consistent with hyperdense cysts. No hydronephrosis or renal obstruction is noted. No renal or ureteral calculi are noted. Stomach/Bowel: Diverticulosis of the colon is noted without inflammation. There is no evidence of bowel obstruction. 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 Vascular/Lymphatic: Aortic atherosclerosis. No enlarged abdominal lymph nodes. Other: No abdominal wall hernia is noted.  No free fluid is noted. Musculoskeletal: No acute or significant osseous findings. IMPRESSION: Gastrostomy tube appears to have been pulled out and is completely external. Multiple rounded hyperdense exophytic abnormalities are seen involving both kidneys most  consistent with hyperdense cysts, but ultrasound is recommended to rule out solid neoplasm. Electronically Signed   By: Lupita Raider, M.D.   On: 09/10/2017 12:58   Mr Brain Wo Contrast  Result Date: 09/04/2017 CLINICAL DATA:  Subclinical status epilepticus. Altered mental status. Patient from [facility]. EXAM: MRI HEAD WITHOUT CONTRAST TECHNIQUE: Multiplanar, multiecho pulse sequences of the brain and surrounding structures were obtained without intravenous contrast. COMPARISON:  None. FINDINGS: Brain: Small focus of restricted diffusion, 3 x 5 mm, LEFT parietal subcortical white matter, corresponding low ADC, consistent with acute infarction. No similar findings elsewhere. No gyriform restriction to suggest interictal or postictal phenomenon. No hemorrhage, mass lesion, or extra-axial fluid. 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 Advanced T2 and FLAIR hyperintensity in the white matter, consistent with small vessel disease. Vascular: Normal flow voids. Skull and upper cervical spine: Normal marrow signal. Sinuses/Orbits: No concerning sinus fluid accumulation or orbital abnormality. Other: Trace mastoid effusions, could be secondary to recumbency. IMPRESSION: Advanced chronic changes of atrophy and small vessel disease, with superimposed acute LEFT parietal subcortical white matter infarct. No MR imaging findings to suggest ongoing epileptiform activity. Electronically Signed   By: Elsie Stain M.D.   On: 09/04/2017 12:29   Ir Replc Gastro/colonic Tube Percut W/fluoro  Result Date: 09/10/2017 INDICATION: Chronic indwelling percutaneous gastrostomy tube has become dislodged. EXAM: REPLACEMENT OF GASTROSTOMY TUBE UNDER FLUOROSCOPY MEDICATIONS: None ANESTHESIA/SEDATION: None CONTRAST:  10 mL Isovue-300 injected into the stomach FLUOROSCOPY TIME:  Fluoroscopy Time: 6 seconds.  4.6 mGy. COMPLICATIONS: None immediate. PROCEDURE: After inspected in the exit site of the previously placed  and now completely dislodged percutaneous gastrostomy tube, an 29 French balloon retention gastrostomy tube was advanced. The retention balloon was inflated 8 mL of saline. The tube was injected with contrast material and a fluoroscopic spot image obtained and saved to confirm position. FINDINGS: A balloon retention catheter was able to be advanced through the pre-existing tract and into the gastric lumen. Contrast injection confirms intraluminal positioning of the catheter at the level of the body of the stomach. IMPRESSION: Replacement of dislodged percutaneous gastrostomy tube with new 18 French balloon retention catheter. The tip of this catheter lies in the body of the stomach. Electronically Signed   By: Irish Lack M.D.   On: 09/10/2017 15:41   Dg Chest Port 1 View  Result Date: 09/02/2017 CLINICAL DATA:  Altered mental status EXAM: PORTABLE CHEST 1 VIEW COMPARISON:  None. FINDINGS: Heart is top-normal in size. There is moderate aortic atherosclerosis without aneurysm. Upper lobe predominant emphysematous hyperinflation with crowding of lower lobe interstitial lung markings. Subpleural atelectasis and/or scarring is seen in the left mid lung. No acute osseous abnormality. IMPRESSION: 1. Upper lobe predominant emphysematous hyperinflation of the lungs with crowding of lower lobe interstitial lung markings. No active pulmonary disease. 2. Aortic atherosclerosis. Electronically Signed   By: Tollie Eth M.D.   On: 09/02/2017 23:30   Dg Abd Portable 2v  Result Date: 09/10/2017 CLINICAL DATA:  Abdominal pain. EXAM: PORTABLE ABDOMEN - 2 VIEW COMPARISON:  No recent. FINDINGS: Soft tissue structures are unremarkable. No bowel distention. Degenerative changes lumbar spine and both hips. No  evidence of fracture or dislocation. Surgical clips right upper quadrant. IMPRESSION: No acute abnormality. Electronically Signed   By: Maisie Fus  Register   On: 09/10/2017 10:42     Subjective: -no complaints    Discharge Exam: Vitals:   09/14/17 0524 09/14/17 0745  BP: (!) 146/75 (!) 155/76  Pulse: 80 84  Resp:  20  Temp:  97.8 F (36.6 C)  SpO2:  99%    General: Pt is alert, awake, not in acute distress Cardiovascular: RRR, S1/S2 +, no rubs, no gallops Respiratory: CTA bilaterally, no wheezing, no rhonchi Abdominal: Soft, NT, ND, bowel sounds + Extremities: no edema, no cyanosis    The results of significant diagnostics from this hospitalization (including imaging, microbiology, ancillary and laboratory) are listed below for reference.     Microbiology: Recent Results (from the past 240 hour(s))  Culture, Urine     Status: Abnormal   Collection Time: 09/04/17  6:39 PM  Result Value Ref Range Status   Specimen Description URINE, CATHETERIZED  Final   Special Requests   Final    Normal Performed at [facility], [address], [place], [place] [address]    Culture MULTIPLE SPECIES PRESENT, SUGGEST RECOLLECTION (A)  Final   Report Status 09/07/2017 FINAL  Final     Labs: BNP (last 3 results) No results for input(s): BNP in the last 8760 hours. Basic Metabolic Panel: Recent Labs  Lab 09/08/17 0653 09/09/17 0515  NA 142 143  K 3.4* 3.5  CL 112* 112*  CO2 24 25  GLUCOSE 162* 162*  BUN 15 13  CREATININE 0.60 0.61  CALCIUM 8.9 8.8*  MG 1.4*  --    Liver Function Tests: Recent Labs  Lab 09/08/17 0653  AST 11*  ALT 11*  ALKPHOS 70  BILITOT 0.4  PROT 5.9*  ALBUMIN 2.7*   No results for input(s): LIPASE, AMYLASE in the last 168 hours. No results for input(s): AMMONIA in the last 168 hours. CBC: Recent Labs  Lab 09/08/17 0653 09/09/17 0515  WBC 7.2 7.0  HGB 9.5* 9.2*  HCT 30.1* 29.4*  MCV 86.2 86.5  PLT 316 297   Cardiac Enzymes: No results for input(s): CKTOTAL, CKMB, CKMBINDEX, TROPONINI in the last 168 hours. BNP: Invalid input(s): POCBNP CBG: Recent Labs  Lab 09/13/17 1546 09/13/17 2017 09/13/17 2355 09/14/17 0411 09/14/17 0740   GLUCAP 143* 129* 166* 199* 201*   D-Dimer No results for input(s): DDIMER in the last 72 hours. Hgb A1c No results for input(s): HGBA1C in the last 72 hours. Lipid Profile No results for input(s): CHOL, HDL, LDLCALC, TRIG, CHOLHDL, LDLDIRECT in the last 72 hours. Thyroid function studies No results for input(s): TSH, T4TOTAL, T3FREE, THYROIDAB in the last 72 hours.  Invalid input(s): FREET3 Anemia work up No results for input(s): VITAMINB12, FOLATE, FERRITIN, TIBC, IRON, RETICCTPCT in the last 72 hours. Urinalysis    Component Value Date/Time   COLORURINE AMBER (A) 09/03/2017 0342   APPEARANCEUR CLOUDY (A) 09/03/2017 0342   LABSPEC 1.020 09/03/2017 0342   PHURINE 7.0 09/03/2017 0342   GLUCOSEU NEGATIVE 09/03/2017 0342   HGBUR NEGATIVE 09/03/2017 0342   BILIRUBINUR NEGATIVE 09/03/2017 0342   KETONESUR 5 (A) 09/03/2017 0342   PROTEINUR 100 (A) 09/03/2017 0342   NITRITE NEGATIVE 09/03/2017 0342   LEUKOCYTESUR LARGE (A) 09/03/2017 0342   Sepsis Labs Invalid input(s): PROCALCITONIN,  WBC,  LACTICIDVEN   Time coordinating discharge: 25 minutes  SIGNED:  Pamella Pert, MD  [facility] 09/14/2017, 11:03 AM  Pager [phone]  If 7PM-7AM, please contact night-coverage [email or web address] Password [email or web address]

## 2019-08-18 ENCOUNTER — Other Ambulatory Visit: Payer: Self-pay

## 2019-08-18 ENCOUNTER — Emergency Department (HOSPITAL_COMMUNITY): Payer: Medicare Other

## 2019-08-18 ENCOUNTER — Encounter (HOSPITAL_COMMUNITY): Payer: Self-pay | Admitting: Emergency Medicine

## 2019-08-18 ENCOUNTER — Inpatient Hospital Stay (HOSPITAL_COMMUNITY)
Admission: EM | Admit: 2019-08-18 | Discharge: 2019-08-22 | DRG: 070 | Disposition: A | Discharge status: Skilled Nursing Facility | Payer: Medicare Other | Source: Skilled Nursing Facility | Attending: Internal Medicine | Admitting: Internal Medicine

## 2019-08-18 DIAGNOSIS — Z9102 Food additives allergy status: Secondary | ICD-10-CM

## 2019-08-18 DIAGNOSIS — G9340 Encephalopathy, unspecified: Secondary | ICD-10-CM

## 2019-08-18 DIAGNOSIS — N3000 Acute cystitis without hematuria: Secondary | ICD-10-CM

## 2019-08-18 DIAGNOSIS — Z794 Long term (current) use of insulin: Secondary | ICD-10-CM | POA: Diagnosis not present

## 2019-08-18 DIAGNOSIS — E1151 Type 2 diabetes mellitus with diabetic peripheral angiopathy without gangrene: Secondary | ICD-10-CM | POA: Diagnosis present

## 2019-08-18 DIAGNOSIS — Z880 Allergy status to penicillin: Secondary | ICD-10-CM

## 2019-08-18 DIAGNOSIS — Z8673 Personal history of transient ischemic attack (TIA), and cerebral infarction without residual deficits: Secondary | ICD-10-CM

## 2019-08-18 DIAGNOSIS — Z66 Do not resuscitate: Secondary | ICD-10-CM | POA: Diagnosis present

## 2019-08-18 DIAGNOSIS — R1319 Other dysphagia: Secondary | ICD-10-CM | POA: Diagnosis not present

## 2019-08-18 DIAGNOSIS — G9341 Metabolic encephalopathy: Secondary | ICD-10-CM | POA: Diagnosis present

## 2019-08-18 DIAGNOSIS — N390 Urinary tract infection, site not specified: Secondary | ICD-10-CM | POA: Diagnosis present

## 2019-08-18 DIAGNOSIS — E871 Hypo-osmolality and hyponatremia: Secondary | ICD-10-CM | POA: Diagnosis present

## 2019-08-18 DIAGNOSIS — E870 Hyperosmolality and hypernatremia: Secondary | ICD-10-CM | POA: Diagnosis present

## 2019-08-18 DIAGNOSIS — E1165 Type 2 diabetes mellitus with hyperglycemia: Secondary | ICD-10-CM | POA: Diagnosis present

## 2019-08-18 DIAGNOSIS — Z79899 Other long term (current) drug therapy: Secondary | ICD-10-CM | POA: Diagnosis not present

## 2019-08-18 DIAGNOSIS — Z91041 Radiographic dye allergy status: Secondary | ICD-10-CM

## 2019-08-18 DIAGNOSIS — E669 Obesity, unspecified: Secondary | ICD-10-CM | POA: Diagnosis present

## 2019-08-18 DIAGNOSIS — E119 Type 2 diabetes mellitus without complications: Secondary | ICD-10-CM

## 2019-08-18 DIAGNOSIS — G40409 Other generalized epilepsy and epileptic syndromes, not intractable, without status epilepticus: Secondary | ICD-10-CM | POA: Diagnosis not present

## 2019-08-18 DIAGNOSIS — I119 Hypertensive heart disease without heart failure: Secondary | ICD-10-CM | POA: Diagnosis present

## 2019-08-18 DIAGNOSIS — L89153 Pressure ulcer of sacral region, stage 3: Secondary | ICD-10-CM | POA: Diagnosis present

## 2019-08-18 DIAGNOSIS — Z20822 Contact with and (suspected) exposure to covid-19: Secondary | ICD-10-CM | POA: Diagnosis present

## 2019-08-18 DIAGNOSIS — R1310 Dysphagia, unspecified: Secondary | ICD-10-CM

## 2019-08-18 DIAGNOSIS — A419 Sepsis, unspecified organism: Secondary | ICD-10-CM | POA: Diagnosis present

## 2019-08-18 DIAGNOSIS — E872 Acidosis: Secondary | ICD-10-CM | POA: Diagnosis present

## 2019-08-18 DIAGNOSIS — I69320 Aphasia following cerebral infarction: Secondary | ICD-10-CM | POA: Diagnosis not present

## 2019-08-18 DIAGNOSIS — G4733 Obstructive sleep apnea (adult) (pediatric): Secondary | ICD-10-CM | POA: Diagnosis present

## 2019-08-18 DIAGNOSIS — Z91018 Allergy to other foods: Secondary | ICD-10-CM

## 2019-08-18 DIAGNOSIS — E785 Hyperlipidemia, unspecified: Secondary | ICD-10-CM | POA: Diagnosis present

## 2019-08-18 DIAGNOSIS — Z7401 Bed confinement status: Secondary | ICD-10-CM

## 2019-08-18 DIAGNOSIS — F0150 Vascular dementia without behavioral disturbance: Secondary | ICD-10-CM | POA: Diagnosis present

## 2019-08-18 DIAGNOSIS — Z7982 Long term (current) use of aspirin: Secondary | ICD-10-CM

## 2019-08-18 DIAGNOSIS — F329 Major depressive disorder, single episode, unspecified: Secondary | ICD-10-CM | POA: Diagnosis present

## 2019-08-18 DIAGNOSIS — G40909 Epilepsy, unspecified, not intractable, without status epilepticus: Secondary | ICD-10-CM | POA: Diagnosis present

## 2019-08-18 DIAGNOSIS — L8990 Pressure ulcer of unspecified site, unspecified stage: Secondary | ICD-10-CM | POA: Insufficient documentation

## 2019-08-18 DIAGNOSIS — Z888 Allergy status to other drugs, medicaments and biological substances status: Secondary | ICD-10-CM

## 2019-08-18 DIAGNOSIS — Z91013 Allergy to seafood: Secondary | ICD-10-CM

## 2019-08-18 DIAGNOSIS — R532 Functional quadriplegia: Secondary | ICD-10-CM | POA: Diagnosis present

## 2019-08-18 DIAGNOSIS — E860 Dehydration: Secondary | ICD-10-CM | POA: Diagnosis present

## 2019-08-18 HISTORY — DX: Major depressive disorder, single episode, unspecified: F32.9

## 2019-08-18 HISTORY — DX: Obesity, unspecified: E66.9

## 2019-08-18 HISTORY — DX: Epilepsy, unspecified, not intractable, without status epilepticus: G40.909

## 2019-08-18 HISTORY — DX: Hypertensive heart disease without heart failure: I11.9

## 2019-08-18 HISTORY — DX: Unspecified dementia, unspecified severity, without behavioral disturbance, psychotic disturbance, mood disturbance, and anxiety: F03.90

## 2019-08-18 HISTORY — DX: Anemia, unspecified: D64.9

## 2019-08-18 HISTORY — DX: Hyperlipidemia, unspecified: E78.5

## 2019-08-18 HISTORY — DX: Peripheral vascular disease, unspecified: I73.9

## 2019-08-18 HISTORY — DX: Aphasia: R47.01

## 2019-08-18 HISTORY — DX: Type 2 diabetes mellitus without complications: E11.9

## 2019-08-18 HISTORY — DX: Dysphagia, unspecified: R13.10

## 2019-08-18 HISTORY — DX: Functional quadriplegia: R53.2

## 2019-08-18 LAB — COMPREHENSIVE METABOLIC PANEL
ALT: 58 U/L — ABNORMAL HIGH (ref 0–44)
AST: 48 U/L — ABNORMAL HIGH (ref 15–41)
Albumin: 3.4 g/dL — ABNORMAL LOW (ref 3.5–5.0)
Alkaline Phosphatase: 109 U/L (ref 38–126)
Anion gap: 14 (ref 5–15)
BUN: 47 mg/dL — ABNORMAL HIGH (ref 8–23)
CO2: 26 mmol/L (ref 22–32)
Calcium: 9.7 mg/dL (ref 8.9–10.3)
Chloride: 115 mmol/L — ABNORMAL HIGH (ref 98–111)
Creatinine, Ser: 0.89 mg/dL (ref 0.44–1.00)
GFR calc Af Amer: >60 mL/min (ref >60)
GFR calc non Af Amer: >60 mL/min (ref >60)
Glucose, Bld: 283 mg/dL — ABNORMAL HIGH (ref 70–99)
Potassium: 4 mmol/L (ref 3.5–5.1)
Sodium: 155 mmol/L — ABNORMAL HIGH (ref 135–145)
Total Bilirubin: 0.5 mg/dL (ref 0.3–1.2)
Total Protein: 7.4 g/dL (ref 6.5–8.1)

## 2019-08-18 LAB — CBC WITH DIFFERENTIAL/PLATELET
Abs Immature Granulocytes: 0.07 10*3/uL (ref 0.00–0.07)
Basophils Absolute: 0.1 10*3/uL (ref 0.0–0.1)
Basophils Relative: 1 %
Eosinophils Absolute: 0.3 10*3/uL (ref 0.0–0.5)
Eosinophils Relative: 3 %
HCT: 44 % (ref 36.0–46.0)
Hemoglobin: 13.3 g/dL (ref 12.0–15.0)
Immature Granulocytes: 1 %
Lymphocytes Relative: 33 %
Lymphs Abs: 4 10*3/uL (ref 0.7–4.0)
MCH: 29.6 pg (ref 26.0–34.0)
MCHC: 30.2 g/dL (ref 30.0–36.0)
MCV: 98 fL (ref 80.0–100.0)
Monocytes Absolute: 0.9 10*3/uL (ref 0.1–1.0)
Monocytes Relative: 7 %
Neutro Abs: 7.1 10*3/uL (ref 1.7–7.7)
Neutrophils Relative %: 55 %
Platelets: 229 10*3/uL (ref 150–400)
RBC: 4.49 MIL/uL (ref 3.87–5.11)
RDW: 14.5 % (ref 11.5–15.5)
WBC: 12.4 10*3/uL — ABNORMAL HIGH (ref 4.0–10.5)
nRBC: 0 % (ref 0.0–0.2)

## 2019-08-18 LAB — URINALYSIS, ROUTINE W REFLEX MICROSCOPIC
Bilirubin Urine: NEGATIVE
Glucose, UA: NEGATIVE mg/dL
Ketones, ur: NEGATIVE mg/dL
Nitrite: NEGATIVE
Protein, ur: 100 mg/dL — AB
RBC / HPF: >50 RBC/hpf — ABNORMAL HIGH (ref 0–5)
Specific Gravity, Urine: 1.03 (ref 1.005–1.030)
WBC, UA: >50 WBC/hpf — ABNORMAL HIGH (ref 0–5)
pH: 6 (ref 5.0–8.0)

## 2019-08-18 LAB — GLUCOSE, CAPILLARY
Glucose-Capillary: 226 mg/dL — ABNORMAL HIGH (ref 70–99)
Glucose-Capillary: 220 mg/dL — ABNORMAL HIGH (ref 70–99)

## 2019-08-18 LAB — HIV ANTIBODY (ROUTINE TESTING W REFLEX): HIV Screen 4th Generation wRfx: NONREACTIVE

## 2019-08-18 LAB — PROTIME-INR
INR: 1.1 (ref 0.8–1.2)
Prothrombin Time: 13.9 seconds (ref 11.4–15.2)

## 2019-08-18 LAB — LACTIC ACID, PLASMA
Lactic Acid, Venous: 2.8 mmol/L (ref 0.5–1.9)
Lactic Acid, Venous: 2.6 mmol/L (ref 0.5–1.9)
Lactic Acid, Venous: 1.4 mmol/L (ref 0.5–1.9)

## 2019-08-18 LAB — CBG MONITORING, ED: Glucose-Capillary: 251 mg/dL — ABNORMAL HIGH (ref 70–99)

## 2019-08-18 LAB — SARS CORONAVIRUS 2 (TAT 6-24 HRS): SARS Coronavirus 2: NEGATIVE

## 2019-08-18 LAB — APTT: aPTT: 33 seconds (ref 24–36)

## 2019-08-18 MED ORDER — GABAPENTIN 100 MG PO CAPS
100.0000 mg | ORAL_CAPSULE | Freq: Every day | ORAL | Status: DC
  Administered 2019-08-18 – 2019-08-21 (×4): 100 mg via ORAL
  Filled 2019-08-18 (×4): qty 1

## 2019-08-18 MED ORDER — FERROUS SULFATE 220 (44 FE) MG/5ML PO ELIX
220.0000 mg | ORAL_SOLUTION | Freq: Every day | ORAL | Status: DC
  Administered 2019-08-18 – 2019-08-22 (×5): 220 mg
  Filled 2019-08-18 (×5): qty 5

## 2019-08-18 MED ORDER — ACETAMINOPHEN 325 MG PO TABS: 650.0000 mg | ORAL_TABLET | Freq: Four times a day (QID) | ORAL | Status: DC | PRN

## 2019-08-18 MED ORDER — BISACODYL 5 MG PO TBEC: 5.0000 mg | DELAYED_RELEASE_TABLET | Freq: Every day | ORAL | Status: DC | PRN

## 2019-08-18 MED ORDER — HYDRALAZINE HCL 20 MG/ML IJ SOLN: 5.0000 mg | INTRAMUSCULAR | Status: DC | PRN

## 2019-08-18 MED ORDER — HYPROMELLOSE (GONIOSCOPIC) 2.5 % OP SOLN
1.0000 [drp] | Freq: Four times a day (QID) | OPHTHALMIC | Status: DC | PRN
  Filled 2019-08-18: qty 15

## 2019-08-18 MED ORDER — INSULIN ASPART 100 UNIT/ML ~~LOC~~ SOLN
0.0000 [IU] | Freq: Three times a day (TID) | SUBCUTANEOUS | Status: DC
  Administered 2019-08-18: 19:00:00 5 [IU] via SUBCUTANEOUS

## 2019-08-18 MED ORDER — ZOLPIDEM TARTRATE 5 MG PO TABS: 5.0000 mg | ORAL_TABLET | Freq: Every evening | ORAL | Status: DC | PRN

## 2019-08-18 MED ORDER — HYDROCODONE-ACETAMINOPHEN 5-325 MG PO TABS
1.0000 | ORAL_TABLET | ORAL | Status: DC | PRN
  Administered 2019-08-18 – 2019-08-19 (×4): 1 via ORAL
  Filled 2019-08-18 (×4): qty 1

## 2019-08-18 MED ORDER — SODIUM CHLORIDE 0.9 % IV SOLN
2.0000 g | Freq: Once | INTRAVENOUS | Status: AC
  Administered 2019-08-18: 01:00:00 2 g via INTRAVENOUS
  Filled 2019-08-18: qty 20

## 2019-08-18 MED ORDER — ATORVASTATIN CALCIUM 40 MG PO TABS
40.0000 mg | ORAL_TABLET | Freq: Every day | ORAL | Status: DC
  Administered 2019-08-18 – 2019-08-21 (×4): 40 mg
  Filled 2019-08-18 (×3): qty 1

## 2019-08-18 MED ORDER — JEVITY 1.5 CAL/FIBER PO LIQD
1000.0000 mL | ORAL | Status: AC
  Administered 2019-08-18 – 2019-08-19 (×2): 1000 mL
  Filled 2019-08-18 (×5): qty 1000

## 2019-08-18 MED ORDER — ACETAMINOPHEN 650 MG RE SUPP: 650.0000 mg | Freq: Four times a day (QID) | RECTAL | Status: DC | PRN

## 2019-08-18 MED ORDER — SERTRALINE HCL 100 MG PO TABS
100.0000 mg | ORAL_TABLET | Freq: Every day | ORAL | Status: DC
  Administered 2019-08-18 – 2019-08-22 (×5): 100 mg
  Filled 2019-08-18 (×6): qty 1

## 2019-08-18 MED ORDER — ASPIRIN 81 MG PO CHEW
81.0000 mg | CHEWABLE_TABLET | Freq: Every day | ORAL | Status: DC
  Administered 2019-08-18 – 2019-08-22 (×5): 81 mg
  Filled 2019-08-18 (×5): qty 1

## 2019-08-18 MED ORDER — MUPIROCIN 2 % EX OINT
1.0000 "application " | TOPICAL_OINTMENT | Freq: Three times a day (TID) | CUTANEOUS | Status: DC
  Administered 2019-08-18 – 2019-08-22 (×12): 1 via TOPICAL
  Filled 2019-08-18 (×2): qty 22

## 2019-08-18 MED ORDER — POLYETHYLENE GLYCOL 3350 17 G PO PACK: 17.0000 g | PACK | Freq: Every day | ORAL | Status: DC | PRN

## 2019-08-18 MED ORDER — ASCORBIC ACID 500 MG PO TABS
500.0000 mg | ORAL_TABLET | Freq: Two times a day (BID) | ORAL | Status: DC
  Administered 2019-08-18 – 2019-08-22 (×9): 500 mg
  Filled 2019-08-18 (×9): qty 1

## 2019-08-18 MED ORDER — LEVETIRACETAM 100 MG/ML PO SOLN
1000.0000 mg | Freq: Two times a day (BID) | ORAL | Status: DC
  Administered 2019-08-18 – 2019-08-22 (×8): 1000 mg
  Filled 2019-08-18 (×11): qty 10

## 2019-08-18 MED ORDER — ENOXAPARIN SODIUM 40 MG/0.4ML ~~LOC~~ SOLN
40.0000 mg | SUBCUTANEOUS | Status: DC
  Administered 2019-08-18 – 2019-08-21 (×4): 40 mg via SUBCUTANEOUS
  Filled 2019-08-18 (×4): qty 0.4

## 2019-08-18 MED ORDER — SODIUM CHLORIDE 0.9 % IV SOLN
2.0000 g | INTRAVENOUS | Status: DC
  Administered 2019-08-19 – 2019-08-22 (×4): 2 g via INTRAVENOUS
  Filled 2019-08-18: qty 20
  Filled 2019-08-18 (×3): qty 2
  Filled 2019-08-18: qty 20

## 2019-08-18 MED ORDER — SODIUM CHLORIDE 0.9 % IV BOLUS
500.0000 mL | Freq: Once | INTRAVENOUS | Status: AC
  Administered 2019-08-18: 04:00:00 500 mL via INTRAVENOUS

## 2019-08-18 MED ORDER — ONDANSETRON HCL 4 MG PO TABS: 4.0000 mg | ORAL_TABLET | Freq: Four times a day (QID) | ORAL | Status: DC | PRN

## 2019-08-18 MED ORDER — MONTELUKAST SODIUM 10 MG PO TABS
10.0000 mg | ORAL_TABLET | Freq: Every day | ORAL | Status: DC
  Administered 2019-08-18 – 2019-08-21 (×4): 10 mg
  Filled 2019-08-18 (×5): qty 1

## 2019-08-18 MED ORDER — JEVITY 1.5 CAL/FIBER PO LIQD
1000.0000 mL | ORAL | Status: DC
  Filled 2019-08-18 (×2): qty 1000

## 2019-08-18 MED ORDER — DOCUSATE SODIUM 100 MG PO CAPS
100.0000 mg | ORAL_CAPSULE | Freq: Two times a day (BID) | ORAL | Status: DC
  Administered 2019-08-18 – 2019-08-20 (×5): 100 mg via ORAL
  Filled 2019-08-18 (×5): qty 1

## 2019-08-18 MED ORDER — CARVEDILOL 6.25 MG PO TABS
6.2500 mg | ORAL_TABLET | Freq: Two times a day (BID) | ORAL | Status: DC
  Administered 2019-08-18 – 2019-08-22 (×8): 6.25 mg
  Filled 2019-08-18 (×9): qty 1

## 2019-08-18 MED ORDER — INSULIN ASPART 100 UNIT/ML ~~LOC~~ SOLN
0.0000 [IU] | Freq: Every day | SUBCUTANEOUS | Status: DC
  Administered 2019-08-18: 22:00:00 2 [IU] via SUBCUTANEOUS

## 2019-08-18 MED ORDER — MORPHINE SULFATE (PF) 2 MG/ML IV SOLN: 2.0000 mg | INTRAVENOUS | Status: DC | PRN

## 2019-08-18 MED ORDER — INSULIN DETEMIR 100 UNIT/ML ~~LOC~~ SOLN
47.0000 [IU] | Freq: Every day | SUBCUTANEOUS | Status: DC
  Administered 2019-08-18 – 2019-08-21 (×4): 47 [IU] via SUBCUTANEOUS
  Filled 2019-08-18 (×5): qty 0.47

## 2019-08-18 MED ORDER — PRO-STAT SUGAR FREE PO LIQD
30.0000 mL | Freq: Two times a day (BID) | ORAL | Status: DC
  Administered 2019-08-18 – 2019-08-21 (×7): 30 mL
  Filled 2019-08-18 (×8): qty 30

## 2019-08-18 MED ORDER — SODIUM CHLORIDE 0.9 % IV SOLN: INTRAVENOUS | Status: DC

## 2019-08-18 MED ORDER — ONDANSETRON HCL 4 MG/2ML IJ SOLN: 4.0000 mg | Freq: Four times a day (QID) | INTRAMUSCULAR | Status: DC | PRN

## 2019-08-18 NOTE — ED Notes (Signed)
Pt daughter Dynita: [phone] call with any updates

## 2019-08-18 NOTE — Progress Notes (Signed)
Purewick placed for UA collection.

## 2019-08-18 NOTE — ED Provider Notes (Addendum)
[facility] EMERGENCY DEPARTMENT Provider Note   CSN: [id number] Arrival date & time: 08/18/19  0015     History Chief Complaint  Patient presents with  . Altered Mental Status    Lethargic    Christine Franklin is a 69 y.o. female.  Patient presents to the emergency department from nursing home.  She is accompanied by her daughter.  Nursing home and daughter report that the patient has not been acting herself today.  Daughter reports that she has been told that her blood sugars have been running high for a week or more.  Patient has had a previous stroke and is aphasic but generally is awake and alert.  At arrival to the ER patient is somnolent.  She awakens to voice but falls back asleep.  Daughter confirms that this is very different level of alertness for her mother. Level V Caveat due to nonverbal patient.        Past Medical History:  Diagnosis Date  . Anemia   . Aphasia   . Dementia (HCC)   . Diabetes mellitus without complication (HCC)   . Dysphagia   . Epilepsy (HCC)   . Functional quadriplegia (HCC)   . Hyperlipidemia   . Hypertensive heart disease   . Major depressive disorder   . Obesity   . PVD (peripheral vascular disease) [facility])     Patient Active Problem List   Diagnosis Date Noted  . Cerebral thrombosis with cerebral infarction 09/06/2017  . Generalized tonic-clonic seizure (HCC) 09/03/2017  . 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 Myoclonic jerking 09/03/2017  . Normocytic anemia 09/03/2017  . Acute encephalopathy 09/02/2017    Past Surgical History:  Procedure Laterality Date  . IR REPLC GASTRO/COLONIC TUBE PERCUT W/FLUORO  09/10/2017     OB History   No obstetric history on file.     No family history on file.  Social History   Tobacco Use  . Smoking status: Never Smoker  . Smokeless tobacco: Never Used  Substance Use Topics  . Alcohol use: Not Currently  . Drug use: Not Currently    Home Medications Prior to  Admission medications   Medication Sig Start Date End Date Taking? Authorizing Provider  acetaminophen (TYLENOL) 325 MG tablet Take 650 mg by mouth every 6 (six) hours as needed for moderate pain.    [provider]  aspirin 325 MG tablet Place 1 tablet (325 mg total) into feeding tube daily. 09/15/17   Leatha Gilding, MD  atorvastatin (LIPITOR) 40 MG tablet Take 1 tablet (40 mg total) by mouth daily at 6 PM. 09/14/17   Gherghe, Daylene Katayama, MD  azelastine (ASTELIN) 0.1 % nasal spray Place 2 sprays into the nose 2 (two) times daily.    [provider]  carvedilol (COREG) 6.25 MG tablet Place 1 tablet (6.25 mg total) into feeding tube 2 (two) times daily with a meal. 09/14/17   Gherghe, Daylene Katayama, MD  docusate sodium (COLACE) 100 MG capsule Take 100 mg by mouth 2 (two) times daily.    [provider]  gabapentin (NEURONTIN) 100 MG capsule Take 100 mg by mouth at bedtime.    [provider]  insulin detemir (LEVEMIR) 100 UNIT/ML injection Inject 30 Units into the skin at bedtime.    [provider]  Ipratropium-Albuterol (COMBIVENT IN) Inhale 1 puff into the lungs every 4 (four) hours.    [provider]  levETIRAcetam (KEPPRA) 100 MG/ML solution Place 10 mLs (1,000 mg total) into  feeding tube 2 (two) times daily. 09/14/17   Caren Griffins, MD  lisinopril (PRINIVIL,ZESTRIL) 2.5 MG tablet Place 1 tablet (2.5 mg total) into feeding tube daily. 09/15/17   Caren Griffins, MD  magnesium oxide (MAG-OX) 400 MG tablet Take 400 mg by mouth 2 (two) times daily.    [provider]  montelukast (SINGULAIR) 10 MG tablet Take 10 mg by mouth at bedtime.    [provider]  Nutritional Supplements (FEEDING SUPPLEMENT, JEVITY 1.5 CAL,) LIQD Place 1,000 mLs into feeding tube continuous.    [provider]  omega-3 acid ethyl esters (LOVAZA) 1 g capsule Take 1 g by mouth 2 (two) times daily.    [provider]    Allergies    Ace  inhibitors, Contrast media [iodinated diagnostic agents], Dust mite extract, Grapefruit extract, Lambs quarters, Penicillins, and Shellfish allergy  Review of Systems   Review of Systems  Unable to perform ROS: Patient nonverbal    Physical Exam Updated Vital Signs BP 108/81   Pulse 90   Temp 100.1 F (37.8 C) (Oral)   Resp 18   Ht 5\' 6"  (1.676 m)   Wt 100 kg   SpO2 96%   BMI 35.58 kg/m   Physical Exam Vitals and nursing note reviewed.  Constitutional:      General: She is not in acute distress.    Appearance: Normal appearance. She is well-developed.     Comments: somnolent  HENT:     Head: Normocephalic and atraumatic.     Right Ear: Hearing normal.     Left Ear: Hearing normal.     Nose: Nose normal.  Eyes:     Conjunctiva/sclera: Conjunctivae normal.     Pupils: Pupils are equal, round, and reactive to light.  Cardiovascular:     Rate and Rhythm: Regular rhythm.     Heart sounds: S1 normal and S2 normal. No murmur. No friction rub. No gallop.   Pulmonary:     Effort: Pulmonary effort is normal. No respiratory distress.     Breath sounds: Normal breath sounds.  Chest:     Chest wall: No tenderness.  Abdominal:     General: Bowel sounds are normal.     Palpations: Abdomen is soft.     Tenderness: There is no abdominal tenderness. There is no guarding or rebound. Negative signs include Murphy's sign and McBurney's sign.     Hernia: No hernia is present.  Musculoskeletal:        General: Normal range of motion.     Cervical back: Normal range of motion and neck supple.  Skin:    General: Skin is warm and dry.     Findings: No rash.  Neurological:     GCS: GCS motor subscore is 6.     Comments: Patient somnolent, awakens eyes to voice but falls back asleep.  Does not follow commands.  Psychiatric:        Speech: Speech normal.        Behavior: Behavior normal.        Thought Content: Thought content normal.     ED Results / Procedures / Treatments    Labs (all labs ordered are listed, but only abnormal results are displayed) Labs Reviewed  LACTIC ACID, PLASMA - Abnormal; Notable for the following components:      Result Value   Lactic Acid, Venous 2.8 (*)    All other components within normal limits  COMPREHENSIVE METABOLIC PANEL - Abnormal; Notable for  the following components:   Sodium 155 (*)    Chloride 115 (*)    Glucose, Bld 283 (*)    BUN 47 (*)    Albumin 3.4 (*)    AST 48 (*)    ALT 58 (*)    All other components within normal limits  CBC WITH DIFFERENTIAL/PLATELET - Abnormal; Notable for the following components:   WBC 12.4 (*)    All other components within normal limits  CBG MONITORING, ED - Abnormal; Notable for the following components:   Glucose-Capillary 251 (*)    All other components within normal limits  CULTURE, BLOOD (ROUTINE X 2)  CULTURE, BLOOD (ROUTINE X 2)  URINE CULTURE  SARS CORONAVIRUS 2 (TAT 6-24 HRS)  APTT  PROTIME-INR  LACTIC ACID, PLASMA    EKG EKG Interpretation  Date/Time:  Friday August 18 2019 00:58:25 EDT Ventricular Rate:  96 PR Interval:    QRS Duration: 105 QT Interval:  357 QTC Calculation: 452 R Axis:   17 Text Interpretation: Sinus rhythm Borderline T wave abnormalities No significant change since last tracing Confirmed by Gilda Crease [phone]) on 08/18/2019 1:05:42 AM   Radiology CT HEAD WO CONTRAST  Result Date: 08/18/2019 CLINICAL DATA:  Altered mental status. EXAM: CT HEAD WITHOUT CONTRAST TECHNIQUE: Contiguous axial images were obtained from the base of the skull through the vertex without intravenous contrast. COMPARISON:  Brain MRI, dated September 04, 2017, is available. FINDINGS: Brain: There is mild cerebral atrophy with widening of the extra-axial spaces and ventricular dilatation. There are areas of decreased attenuation within the white matter tracts of the supratentorial brain, consistent with microvascular disease changes. Vascular: No hyperdense vessel or  unexpected calcification. Skull: Normal. Negative for fracture or focal lesion. Sinuses/Orbits: No acute finding. Other: None. IMPRESSION: 1. Generalized cerebral atrophy. 2. No acute intracranial abnormality. Electronically Signed   By: Aram Candela M.D.   On: 08/18/2019 02:29   DG Chest Port 1 View  Result Date: 08/18/2019 CLINICAL DATA:  Fever and altered mental status. EXAM: PORTABLE CHEST 1 VIEW COMPARISON:  September 02, 2017 FINDINGS: Mild, stable areas of linear scarring and/or atelectasis are seen within the bilateral lung bases. There is no evidence of a pleural effusion or pneumothorax. The heart size and mediastinal contours are within normal limits. There is mild calcification of the aortic arch. Multilevel degenerative changes seen throughout the thoracic spine. IMPRESSION: Mild bibasilar linear scarring and/or atelectasis. Electronically Signed   By: Aram Candela M.D.   On: 08/18/2019 01:37    Procedures Procedures (including critical care time)  Medications Ordered in ED Medications  sodium chloride 0.9 % bolus 500 mL (has no administration in time range)  cefTRIAXone (ROCEPHIN) 2 g in sodium chloride 0.9 % 100 mL IVPB (0 g Intravenous Stopped 08/18/19 0144)    ED Course  I have reviewed the triage vital signs and the nursing notes.  Pertinent labs & imaging results that were available during my care of the patient were reviewed by me and considered in my medical decision making (see chart for details).    MDM Rules/Calculators/A&P                      Patient brought to the emergency department from nursing home for evaluation of altered mental status.  Neurologic exam is difficult to evaluate because patient has a history of functional quadriplegia as well as stroke with aphasia.  She was accompanied by her daughter who confirmed that the patient  is usually much more awake and alert.  She did have a low-grade fever at arrival.  Sepsis considered likely, initiated on  antibiotic coverage.  Lactic acid 2.8, no sign of severe sepsis or septic shock.  BUN significantly elevated with normal creatinine, elevated sodium noted as well.  These findings consistent with dehydration.  Administer gentle hydration and Rocephin.  In and out catheterization revealed brown, purulent urine.  Urine was too thick to perform urinalysis, culture pending.  Will treat for sepsis secondary to urinary tract infection.    Final Clinical Impression(s) / ED Diagnoses Final diagnoses:  Sepsis, due to unspecified organism, unspecified whether acute organ dysfunction present [facility])  Urinary tract infection without hematuria, site unspecified  Hypernatremia    Rx / DC Orders ED Discharge Orders    None       Latesha Chesney, Canary Brim, MD 08/18/19 0310    Gilda Crease, MD 08/18/19 [phone]

## 2019-08-18 NOTE — ED Triage Notes (Signed)
Patient arrived with EMS from [facility] nursing home , staff reported lethargic " not herself" / altered LOC , history of CVA with aphasia and quadriplegia .

## 2019-08-18 NOTE — Progress Notes (Signed)
Requested WOC consult for sacral pressure injury which is present on admission. Ordered mattress replacement for pt as well as prevalon boots bilaterally for pt heels.  Notified Dr. Ophelia Charter of drainage around the PEG tube which is greenish in color.

## 2019-08-18 NOTE — H&P (Signed)
History and Physical    Christine Franklin CZY:[id number] DOB: 21-Sep-1950 DOA: 08/18/2019  PCP: Benito Mccreedy, MD; [facility] Nurse, Joelene Millin Consultants:  None Patient coming from: [facility]; [facility]: Daughter, Dynita, [phone]  Chief Complaint: Lethargy  HPI: Christine Franklin is a 69 y.o. female with medical history significant of PVD; HTN; HLD; CVA with functional quadriplegia/aphasia/nonverbal/PEG tube; seizure d/o; DM; and dementia presenting with lethargy.  History obtained from her daughter, who was at the the bedside.  Since about 3/27, she has been having elevated glucose (270-421).  Night before last, her oxygen dropped and she needed breathing treatments.  She was running a fever then and last night and her oxygen dropped again and so they sent her to the ER.  Fever was 101+.  She does have h/o OSA, used CPAP until 2019 when she had CVA; since then, she hasn't needed O2/CPAP/breathing treatments.  Since her stroke, she is bed ridden but has "movement on both sides".  She is NPO and uses her feeding tube for all meds.  She is mostly nonverbal.  They did not mention any concerns about her urine.    ED Course:  Carryover, per Dr. Marlowe Sax:  Nursing home patient with history of stroke, aphasia, nonverbal at baseline, functional quadriplegia sent here as she has been less alert. Very somnolent on arrival to the ED. Temperature 100.1. Labs showing mild leukocytosis and mild lactic acidosis. Head CT negative. Chest x-ray negative. UA very thick/with pus. Urine culture pending. Started on treatment for UTI and IV fluids  Review of Systems: Unable to perform  Ambulatory Status:  Non-ambulatory  COVID Vaccine Status:   Complete  Past Medical History:  Diagnosis Date  . Anemia   . Aphasia   . CVA (cerebral vascular accident) ([place]) 2019  . Dementia ([place])   . Diabetes mellitus without complication ([place])   . Dysphagia   . Epilepsy ([place])   . Functional quadriplegia (HCC)   .  Hyperlipidemia   . Hypertensive heart disease   . Major depressive disorder   . Obesity   . PVD (peripheral vascular disease) ([place])     Past Surgical History:  Procedure Laterality Date  . IR REPLC GASTRO/COLONIC TUBE PERCUT W/FLUORO  09/10/2017    Social History   Socioeconomic History  . Marital status: Divorced    Spouse name: Not on file  . Number of children: Not on file  . Years of education: Not on file  . Highest education level: Not on file  Occupational History  . Occupation: retired/disabled  Tobacco Use  . Smoking status: Former Smoker    Packs/day: 0.50    Years: 50.00    Pack years: 25.00    Quit date: 2019    Years since quitting: 2.2  . Smokeless tobacco: Never Used  Substance and Sexual Activity  . Alcohol use: Not Currently  . Drug use: Not Currently  . Sexual activity: Not on file  Other Topics Concern  . Not on file  Social History Narrative  . Not on file   Social Determinants of Health   Financial Resource Strain:   . Difficulty of Paying Living Expenses:   Food Insecurity:   . Worried About Charity fundraiser in the Last Year:   . Arboriculturist in the Last Year:   Transportation Needs:   . Film/video editor (Medical):   Marland Kitchen Lack of Transportation (Non-Medical):   Physical Activity:   . Days of Exercise per Week:   .  Minutes of Exercise per Session:   Stress:   . Feeling of Stress :   Social Connections:   . Frequency of Communication with Friends and Family:   . Frequency of Social Gatherings with Friends and Family:   . Attends Religious Services:   . Active Member of Clubs or Organizations:   . Attends Banker Meetings:   Marland Kitchen Marital Status:   Intimate Partner Violence:   . Fear of Current or Ex-Partner:   . Emotionally Abused:   Marland Kitchen Physically Abused:   . Sexually Abused:     Allergies  Allergen Reactions  . Ace Inhibitors   . Contrast Media [Iodinated Diagnostic Agents]   . Dust Mite Extract   .  Grapefruit Extract   . Lambs Quarters   . Penicillins   . Shellfish Allergy     History reviewed. No pertinent family history.  Prior to Admission medications   Medication Sig Start Date End Date Taking? Authorizing Provider  acetaminophen (TYLENOL) 325 MG tablet Take 650 mg by mouth every 6 (six) hours as needed for moderate pain.    [provider]  aspirin 325 MG tablet Place 1 tablet (325 mg total) into feeding tube daily. 09/15/17   Leatha Gilding, MD  atorvastatin (LIPITOR) 40 MG tablet Take 1 tablet (40 mg total) by mouth daily at 6 PM. 09/14/17   Gherghe, Daylene Katayama, MD  azelastine (ASTELIN) 0.1 % nasal spray Place 2 sprays into the nose 2 (two) times daily.    [provider]  carvedilol (COREG) 6.25 MG tablet Place 1 tablet (6.25 mg total) into feeding tube 2 (two) times daily with a meal. 09/14/17   Gherghe, Daylene Katayama, MD  docusate sodium (COLACE) 100 MG capsule Take 100 mg by mouth 2 (two) times daily.    [provider]  gabapentin (NEURONTIN) 100 MG capsule Take 100 mg by mouth at bedtime.    [provider]  insulin detemir (LEVEMIR) 100 UNIT/ML injection Inject 30 Units into the skin at bedtime.    [provider]  Ipratropium-Albuterol (COMBIVENT IN) Inhale 1 puff into the lungs every 4 (four) hours.    [provider]  levETIRAcetam (KEPPRA) 100 MG/ML solution Place 10 mLs (1,000 mg total) into feeding tube 2 (two) times daily. 09/14/17   Leatha Gilding, MD  lisinopril (PRINIVIL,ZESTRIL) 2.5 MG tablet Place 1 tablet (2.5 mg total) into feeding tube daily. 09/15/17   Leatha Gilding, MD  magnesium oxide (MAG-OX) 400 MG tablet Take 400 mg by mouth 2 (two) times daily.    [provider]  montelukast (SINGULAIR) 10 MG tablet Take 10 mg by mouth at bedtime.    [provider]  Nutritional Supplements (FEEDING SUPPLEMENT, JEVITY 1.5 CAL,) LIQD Place 1,000 mLs into feeding tube continuous.    [provider]  omega-3 acid ethyl esters (LOVAZA) 1 g capsule Take 1 g by mouth 2 (two) times daily.    [provider]    Physical Exam: Vitals:   08/18/19 1100 08/18/19 1130 08/18/19 1436 08/18/19 1441  BP: 122/87 133/88  101/66  Pulse: 86 86  83  Resp: 17 (!) 22  16  Temp:    100 F (37.8 C)  TempSrc:    Oral  SpO2: 98% 100%  100%  Weight:   85.5 kg   Height:   5\' 8"  (1.727 m)      . General:  Appears calm and comfortable and is NAD; opens eyes  to voice, touch but is nonverbal and does not attempt to engage or track at all . Eyes:  PERRL, EOMI, normal lids, iris . ENT:  grossly normal lips & tongue, mildly dry mm . Neck:  no LAD, masses or thyromegaly . Cardiovascular:  RRR, no m/r/g. No LE edema.  Marland Kitchen Respiratory:   CTA bilaterally with no wheezes/rales/rhonchi.  Normal respiratory effort. . Abdomen:  soft, NT, ND, NABS; PEG tube in place with mild surrounding erythema . Skin:  Sacral pressure ulcer, per nursing report . Musculoskeletal:  decreased tone BUE/BLE, good ROM, no bony abnormality . Psychiatric:  Eyes eyes to voice, touch, without effort to engage; does not track . Neurologic:  Unable to perform    Radiological Exams on Admission: CT HEAD WO CONTRAST  Result Date: 08/18/2019 CLINICAL DATA:  Altered mental status. EXAM: CT HEAD WITHOUT CONTRAST TECHNIQUE: Contiguous axial images were obtained from the base of the skull through the vertex without intravenous contrast. COMPARISON:  Brain MRI, dated September 04, 2017, is available. FINDINGS: Brain: There is mild cerebral atrophy with widening of the extra-axial spaces and ventricular dilatation. There are areas of decreased attenuation within the white matter tracts of the supratentorial brain, consistent with microvascular disease changes. Vascular: No hyperdense vessel or unexpected calcification. Skull: Normal. Negative for fracture or focal lesion. Sinuses/Orbits: No acute finding. Other: None. IMPRESSION: 1. Generalized  cerebral atrophy. 2. No acute intracranial abnormality. Electronically Signed   By: Aram Candela M.D.   On: 08/18/2019 02:29   DG Chest Port 1 View  Result Date: 08/18/2019 CLINICAL DATA:  Fever and altered mental status. EXAM: PORTABLE CHEST 1 VIEW COMPARISON:  September 02, 2017 FINDINGS: Mild, stable areas of linear scarring and/or atelectasis are seen within the bilateral lung bases. There is no evidence of a pleural effusion or pneumothorax. The heart size and mediastinal contours are within normal limits. There is mild calcification of the aortic arch. Multilevel degenerative changes seen throughout the thoracic spine. IMPRESSION: Mild bibasilar linear scarring and/or atelectasis. Electronically Signed   By: Aram Candela M.D.   On: 08/18/2019 01:37    EKG: Independently reviewed.  NSR with rate 96; nonspecific ST changes with no evidence of acute ischemia; NSCSLT   Labs on Admission: I have personally reviewed the available labs and imaging studies at the time of the admission.  Pertinent labs:   Na++ 155 Glucose 283 BUN 47/Creatinine 0.89/GFR >60 AST 48/ALT 58 Lactate 2.8, 2.6, 1.4 WBC 12.4 Blood cultures pending  UA pending  Urine culture COVID pending   Assessment/Plan Active Problems:   Acute encephalopathy   Generalized tonic-clonic seizure (HCC)   UTI (urinary tract infection)   History of CVA (cerebrovascular accident)   Functional quadriplegia (HCC)   Epilepsy (HCC)   Diabetes mellitus without complication (HCC)   Dysphagia   Hyperlipidemia   Hypertensive heart disease   AMS -Acute metabolic encephalopathy thought to be in the setting of acute infection -While the patient does have underlying dementia, this is a change compared to her usual baseline mental status -Evaluation thus far unremarkable -Source of infection is thought to be related to UTI; however, UA and culture are pending -Meanwhile, she also has an advanced sacral pressure ulcer as well as  erythema and purulent drainage surrounding her PEG tube and these are also potential sources of infection -She does appear to be mildly dehydrated, as evidenced by her hypernatremia, but she does not have renal failure at this time -She does not have evidence of  sepsis at this time; elevated lactate is likely due to dehydration and has cleared -Will admit to Med Surg with IVF hydration -The family would benefit from being referred to the [facility] and also provided with the [facility] website  Chronic encephalopathy -Patient with chronic vascular dementia with h/o CVA with functional quadriplegia/aphasia/nonverbal -She is different from her baseline in that she is usually more awake and alert and maybe occasionally can provide monosyllabic utterances; she will usually turn to look at her daughter when she calls her full name -Continue ASA -Continue Zoloft  HTN -Continue Coreg  HLD -Continue Lipitor  Dysphagia -Patient has a PEG tube and receives all nutrition and medications via tube  DM -Recently started on Trulicity, will hold -Will check A1c; prior was 7.8 in 08/2017 -Continue Levemir -Cover with moderate-scale SSI  Seizure d/o -Continue Keppra, Neurontin   Note: This patient has been tested and is negative for the novel coronavirus COVID-19.  DVT prophylaxis:   Lovenox  Code Status:  DNR - confirmed with bedside paperwork/family Family Communication: Her daughter was present throughout the evaluation Disposition Plan: She is anticipated to d/c to home without [facility] services once her AMS issues have been resolved. Consults called: wound care Admission status: Admit - It is my clinical opinion that admission to INPATIENT is reasonable and necessary because of the expectation that this patient will require hospital care that crosses at least 2 midnights to treat this condition based on the medical complexity of the problems presented.  Given the aforementioned  information, the predictability of an adverse outcome is felt to be significant.    Jonah Blue MD [facility]   How to contact the [facility] Attending or Consulting provider 7A - 7P or covering provider during after hours 7P -7A, for this patient?  1. Check the care team in [facility] and look for a) attending/consulting [facility] provider listed and b) the [facility] team listed 2. Log into [email or web address] and use [facility]'s universal password to access. If you do not have the password, please contact the hospital operator. 3. Locate the [facility] provider you are looking for under [facility] and page to a number that you can be directly reached. 4. If you still have difficulty reaching the provider, please page the [facility] (Director on Call) for the Hospitalists listed on amion for assistance.   08/18/2019, 4:51 PM

## 2019-08-18 NOTE — Social Work (Signed)
Pt from [facility]. Have updated liaison Olegario Messier with pt floor.   CSW continuing to follow for support with disposition when medically appropriate.  Christine Franklin, MSW, LCSW [facility] Clinical Social Work

## 2019-08-18 NOTE — Progress Notes (Signed)
Pt has stage 3 sacral pressure injury which was present on admission.  Will document dimensions in the flowsheet.  Daughter shown pressure injury.

## 2019-08-19 DIAGNOSIS — L8990 Pressure ulcer of unspecified site, unspecified stage: Secondary | ICD-10-CM | POA: Insufficient documentation

## 2019-08-19 DIAGNOSIS — N390 Urinary tract infection, site not specified: Secondary | ICD-10-CM | POA: Diagnosis not present

## 2019-08-19 LAB — HEMOGLOBIN A1C
Hgb A1c MFr Bld: 8.4 % — ABNORMAL HIGH (ref 4.8–5.6)
Mean Plasma Glucose: 194 mg/dL

## 2019-08-19 LAB — BASIC METABOLIC PANEL
Anion gap: 13 (ref 5–15)
Anion gap: 8 (ref 5–15)
BUN: 43 mg/dL — ABNORMAL HIGH (ref 8–23)
BUN: 38 mg/dL — ABNORMAL HIGH (ref 8–23)
CO2: 25 mmol/L (ref 22–32)
CO2: 27 mmol/L (ref 22–32)
Calcium: 9.1 mg/dL (ref 8.9–10.3)
Calcium: 9.4 mg/dL (ref 8.9–10.3)
Chloride: 120 mmol/L — ABNORMAL HIGH (ref 98–111)
Chloride: 121 mmol/L — ABNORMAL HIGH (ref 98–111)
Creatinine, Ser: 0.65 mg/dL (ref 0.44–1.00)
Creatinine, Ser: 0.61 mg/dL (ref 0.44–1.00)
GFR calc Af Amer: >60 mL/min (ref >60)
GFR calc Af Amer: >60 mL/min (ref >60)
GFR calc non Af Amer: >60 mL/min (ref >60)
GFR calc non Af Amer: >60 mL/min (ref >60)
Glucose, Bld: 324 mg/dL — ABNORMAL HIGH (ref 70–99)
Glucose, Bld: 329 mg/dL — ABNORMAL HIGH (ref 70–99)
Potassium: 3.4 mmol/L — ABNORMAL LOW (ref 3.5–5.1)
Potassium: 3.5 mmol/L (ref 3.5–5.1)
Sodium: 158 mmol/L — ABNORMAL HIGH (ref 135–145)
Sodium: 156 mmol/L — ABNORMAL HIGH (ref 135–145)

## 2019-08-19 LAB — GLUCOSE, CAPILLARY
Glucose-Capillary: 266 mg/dL — ABNORMAL HIGH (ref 70–99)
Glucose-Capillary: 271 mg/dL — ABNORMAL HIGH (ref 70–99)
Glucose-Capillary: 272 mg/dL — ABNORMAL HIGH (ref 70–99)
Glucose-Capillary: 300 mg/dL — ABNORMAL HIGH (ref 70–99)
Glucose-Capillary: 313 mg/dL — ABNORMAL HIGH (ref 70–99)
Glucose-Capillary: 313 mg/dL — ABNORMAL HIGH (ref 70–99)

## 2019-08-19 LAB — URINE CULTURE

## 2019-08-19 LAB — CBC
HCT: 39 % (ref 36.0–46.0)
Hemoglobin: 11.7 g/dL — ABNORMAL LOW (ref 12.0–15.0)
MCH: 29.6 pg (ref 26.0–34.0)
MCHC: 30 g/dL (ref 30.0–36.0)
MCV: 98.7 fL (ref 80.0–100.0)
Platelets: 183 10*3/uL (ref 150–400)
RBC: 3.95 MIL/uL (ref 3.87–5.11)
RDW: 14.1 % (ref 11.5–15.5)
WBC: 9 10*3/uL (ref 4.0–10.5)
nRBC: 0 % (ref 0.0–0.2)

## 2019-08-19 MED ORDER — HYDROCERIN EX CREA
TOPICAL_CREAM | Freq: Two times a day (BID) | CUTANEOUS | Status: DC
  Administered 2019-08-20: 1 via TOPICAL
  Filled 2019-08-19: qty 113

## 2019-08-19 MED ORDER — FREE WATER
200.0000 mL | Status: DC
  Administered 2019-08-19 – 2019-08-20 (×7): 200 mL

## 2019-08-19 MED ORDER — HYDROCODONE-ACETAMINOPHEN 5-325 MG PO TABS
1.0000 | ORAL_TABLET | ORAL | Status: DC | PRN
  Administered 2019-08-19 – 2019-08-20 (×2): 1
  Administered 2019-08-21: 21:00:00 2
  Filled 2019-08-19 (×2): qty 1
  Filled 2019-08-19: qty 2

## 2019-08-19 MED ORDER — INSULIN ASPART 100 UNIT/ML ~~LOC~~ SOLN
0.0000 [IU] | SUBCUTANEOUS | Status: DC
  Administered 2019-08-19: 02:00:00 11 [IU] via SUBCUTANEOUS
  Administered 2019-08-19 (×2): 8 [IU] via SUBCUTANEOUS
  Administered 2019-08-19: 12:00:00 11 [IU] via SUBCUTANEOUS
  Administered 2019-08-19 (×2): 8 [IU] via SUBCUTANEOUS
  Administered 2019-08-20 (×2): 3 [IU] via SUBCUTANEOUS
  Administered 2019-08-20: 08:00:00 5 [IU] via SUBCUTANEOUS
  Administered 2019-08-20: 8 [IU] via SUBCUTANEOUS
  Administered 2019-08-20: 12:00:00 5 [IU] via SUBCUTANEOUS
  Administered 2019-08-20 – 2019-08-21 (×5): 3 [IU] via SUBCUTANEOUS
  Administered 2019-08-21 (×2): 5 [IU] via SUBCUTANEOUS
  Administered 2019-08-22: 08:00:00 2 [IU] via SUBCUTANEOUS
  Administered 2019-08-22: 3 [IU] via SUBCUTANEOUS

## 2019-08-19 MED ORDER — FREE WATER
200.0000 mL | Freq: Four times a day (QID) | Status: DC
  Administered 2019-08-19 (×2): 200 mL

## 2019-08-19 MED ORDER — FREE WATER: 200.0000 mL | Status: DC

## 2019-08-19 NOTE — Progress Notes (Signed)
[facility] Progress Note  Patient: Christine Franklin    KYH:[id number]  DOA: 08/18/2019     Date of Service: the patient was seen and examined on 08/19/2019  Chief Complaint  Patient presents with  . Altered Mental Status    Lethargic   Brief hospital course: Christine Franklin is a 69 y.o. female with medical history significant of PVD; HTN; HLD; CVA with functional quadriplegia/aphasia/nonverbal/PEG tube; seizure d/o; DM; and dementia presenting with lethargy.  History obtained from her daughter, who was at the the bedside.  Since about 3/27, she has been having elevated glucose (270-421).  Night before last, her oxygen dropped and she needed breathing treatments.  She was running a fever then and last night and her oxygen dropped again and so they sent her to the ER.  Fever was 101+.  She does have h/o OSA, used CPAP until 2019 when she had CVA; since then, she hasn't needed O2/CPAP/breathing treatments.  Since her stroke, she is bed ridden but has "movement on both sides".  She is NPO and uses her feeding tube for all meds.  She is mostly nonverbal.  They did not mention any concerns about her urine.  Currently further plan is treat hyponatremia with free water and monitor response and improvement in mentation follow-up on cultures.  Assessment and Plan: 1.  Acute on metabolic encephalopathy Underlying dementia likely delirium. Hypernatremia likely from dehydration. UTI Multifactorial acute on chronic encephalopathy. At her baseline the patient is nonverbal, occasionally smiles and opens up her eyes on verbal cues.  Otherwise dependent on care. UA shows pyuria and patient is started on IV ceftriaxone. Cultures so far negative. Patient also has severe hyponatremia currently being treated with free water. Patient was started on IV fluid with IV normal saline which I will discontinue.  2.  Chronic vascular dementia. History of CVA with functional quadriplegia. Continue aspirin continue  Zoloft.  3.  Essential hypertension Continue Coreg  4.  Hyperlipidemia Continue Crestor  5.  Dysphagia. SP PEG tube placement. Continue nutrition tube feedings.  6.  Type 2 diabetes mellitus Continue Levemir and sliding scale insulin.  7.  History of seizure disorders. Continue home medications.   8.  Stage III sacral pressure ulcer. Frequent turning and dressing changes.  Pressure Injury 08/18/19 Left (Active)  08/18/19 1700  Location:   Location Orientation: Left  Staging:   Wound Description (Comments):   Present on Admission:      Pressure Injury 08/18/19 Right Stage 3 -  Full thickness tissue loss. Subcutaneous fat may be visible but bone, tendon or muscle are NOT exposed. (Active)  08/18/19 1700  Location:   Location Orientation: Right  Staging: Stage 3 -  Full thickness tissue loss. Subcutaneous fat may be visible but bone, tendon or muscle are NOT exposed.  Wound Description (Comments):   Present on Admission: Yes     Diet: Tube feeding NPO DVT Prophylaxis: Subcutaneous Lovenox   Advance goals of care discussion: DNR  Family Communication: family was present at bedside, at the time of interview.  The pt provided permission to discuss medical plan with the family. Opportunity was given to ask question and all questions were answered satisfactorily.   Disposition:  Pt is from SNF, admitted with hyponatremia and UTI, still has sodium 156, which precludes a safe discharge. Discharge to SNF, when medically stable.  Subjective: Minimally responsive.  No nausea no vomiting.  No acute events overnight.  Physical Exam: General:  lethargic not oriented to time, place, and  person.  Appear in mild distress, affect unresponsive Eyes: PERRL ENT: Oral Mucosa Clear, dry  Neck: difficult to assess  JVD,  Cardiovascular: S1 and S2 Present, no Murmur,  Respiratory: good respiratory effort, Bilateral Air entry equal and Decreased, bilateral  Crackles, no  wheezes Abdomen: Bowel Sound present, Soft and difficult to assess tenderness,  Skin: no rash Extremities: no Pedal edema, difficult to assess  calf tenderness Neurologic: Quadriplegia, aphasia, not following commands.  Gait not checked due to patient safety concerns  Vitals:   08/18/19 1441 08/18/19 2013 08/19/19 0358 08/19/19 1435  BP: 101/66 119/90 (!) 133/93 (!) 137/94  Pulse: 83 75 79 72  Resp: 16   16  Temp: 100 F (37.8 C) 98.9 F (37.2 C) 97.9 F (36.6 C) 97.6 F (36.4 C)  TempSrc: Oral Oral Oral Axillary  SpO2: 100% 100% 99% 99%  Weight:      Height:        Intake/Output Summary (Last 24 hours) at 08/19/2019 1928 Last data filed at 08/19/2019 1436 Gross per 24 hour  Intake 1906.51 ml  Output 250 ml  Net 1656.51 ml   Filed Weights   08/18/19 0021 08/18/19 1436  Weight: 100 kg 85.5 kg    Data Reviewed: I have personally reviewed and interpreted daily labs, tele strips, imagings as discussed above. I reviewed all nursing notes, pharmacy notes, vitals, pertinent old records I have discussed plan of care as described above with RN and patient/family.  CBC: Recent Labs  Lab 08/18/19 0034 08/19/19 0345  WBC 12.4* 9.0  NEUTROABS 7.1  --   HGB 13.3 11.7*  HCT 44.0 39.0  MCV 98.0 98.7  PLT 229 443   Basic Metabolic Panel: Recent Labs  Lab 08/18/19 0034 08/19/19 0345 08/19/19 1526  NA 155* 158* 156*  K 4.0 3.4* 3.5  CL 115* 120* 121*  CO2 26 25 27   GLUCOSE 283* 324* 329*  BUN 47* 43* 38*  CREATININE 0.89 0.65 0.61  CALCIUM 9.7 9.1 9.4    Studies: No results found.  Scheduled Meds: . ascorbic acid  500 mg Per Tube BID  . aspirin  81 mg Per Tube Daily  . atorvastatin  40 mg Per Tube q1600  . carvedilol  6.25 mg Per Tube BID WC  . docusate sodium  100 mg Oral BID  . enoxaparin (LOVENOX) injection  40 mg Subcutaneous Q24H  . feeding supplement (PRO-STAT SUGAR FREE 64)  30 mL Per Tube BID  . ferrous sulfate  220 mg Per Tube Daily  . free water  200  mL Per Tube Q4H  . gabapentin  100 mg Oral QHS  . hydrocerin   Topical BID  . insulin aspart  0-15 Units Subcutaneous Q4H  . insulin detemir  47 Units Subcutaneous QHS  . levETIRAcetam  1,000 mg Per Tube BID  . montelukast  10 mg Per Tube QHS  . mupirocin ointment  1 application Topical TID  . sertraline  100 mg Per Tube Daily   Continuous Infusions: . cefTRIAXone (ROCEPHIN)  IV 2 g (08/19/19 0055)  . feeding supplement (JEVITY 1.5 CAL/FIBER) 1,000 mL (08/19/19 1442)   PRN Meds: acetaminophen **OR** acetaminophen, bisacodyl, hydrALAZINE, HYDROcodone-acetaminophen, hydroxypropyl methylcellulose / hypromellose, morphine injection, ondansetron **OR** ondansetron (ZOFRAN) IV, polyethylene glycol, zolpidem  Time spent: 35 minutes  Author: Berle Mull, MD [facility] 08/19/2019 7:28 PM  To reach On-call, see care teams to locate the attending and reach out to them via [email or web address]. If 7PM-7AM, please contact night-coverage If  you still have difficulty reaching the attending provider, please page the [facility] (Director on Call) for [facility] on amion for assistance.

## 2019-08-19 NOTE — Progress Notes (Signed)
Dr. Lynden Oxford and Launa Flight, RN (pt's nurse) and I looked at her buttocks and areas of concern.  Dr. Allena Katz took a picture for the chart and explained to the daughter who was present with [place] also.  Pt has air loss bed, bilateral Prevalon boots on and I ordered eucerin cream for dry, scaly areas.  Will order Interdry AG for her folds and explained the use of this to her daughter.  We placed a sacral foam after cleansing the patient.

## 2019-08-20 DIAGNOSIS — N390 Urinary tract infection, site not specified: Secondary | ICD-10-CM | POA: Diagnosis not present

## 2019-08-20 LAB — CBC
HCT: 37 % (ref 36.0–46.0)
Hemoglobin: 11.4 g/dL — ABNORMAL LOW (ref 12.0–15.0)
MCH: 29.1 pg (ref 26.0–34.0)
MCHC: 30.8 g/dL (ref 30.0–36.0)
MCV: 94.4 fL (ref 80.0–100.0)
Platelets: 155 10*3/uL (ref 150–400)
RBC: 3.92 MIL/uL (ref 3.87–5.11)
RDW: 13.7 % (ref 11.5–15.5)
WBC: 7.3 10*3/uL (ref 4.0–10.5)
nRBC: 0 % (ref 0.0–0.2)

## 2019-08-20 LAB — BASIC METABOLIC PANEL
Anion gap: 9 (ref 5–15)
BUN: 31 mg/dL — ABNORMAL HIGH (ref 8–23)
CO2: 25 mmol/L (ref 22–32)
Calcium: 8.9 mg/dL (ref 8.9–10.3)
Chloride: 119 mmol/L — ABNORMAL HIGH (ref 98–111)
Creatinine, Ser: 0.56 mg/dL (ref 0.44–1.00)
GFR calc Af Amer: >60 mL/min (ref >60)
GFR calc non Af Amer: >60 mL/min (ref >60)
Glucose, Bld: 235 mg/dL — ABNORMAL HIGH (ref 70–99)
Potassium: 3.5 mmol/L (ref 3.5–5.1)
Sodium: 153 mmol/L — ABNORMAL HIGH (ref 135–145)

## 2019-08-20 LAB — GLUCOSE, CAPILLARY
Glucose-Capillary: 199 mg/dL — ABNORMAL HIGH (ref 70–99)
Glucose-Capillary: 255 mg/dL — ABNORMAL HIGH (ref 70–99)
Glucose-Capillary: 193 mg/dL — ABNORMAL HIGH (ref 70–99)
Glucose-Capillary: 203 mg/dL — ABNORMAL HIGH (ref 70–99)
Glucose-Capillary: 216 mg/dL — ABNORMAL HIGH (ref 70–99)
Glucose-Capillary: 195 mg/dL — ABNORMAL HIGH (ref 70–99)

## 2019-08-20 MED ORDER — FREE WATER
200.0000 mL | Status: DC
  Administered 2019-08-20 – 2019-08-22 (×12): 200 mL

## 2019-08-20 MED ORDER — FREE WATER: 300.0000 mL | Status: DC

## 2019-08-20 MED ORDER — ZINC OXIDE 40 % EX OINT
TOPICAL_OINTMENT | Freq: Two times a day (BID) | CUTANEOUS | Status: DC
  Filled 2019-08-20: qty 57

## 2019-08-20 NOTE — Progress Notes (Signed)
[facility] Progress Note  Patient: Christine Franklin    ZYS:[id number]  DOA: 08/18/2019     Date of Service: the patient was seen and examined on 08/20/2019  Chief Complaint  Patient presents with  . Altered Mental Status    Lethargic   Brief hospital course: Catrice Zuleta is a 69 y.o. female with medical history significant of PVD; HTN; HLD; CVA with functional quadriplegia/aphasia/nonverbal/PEG tube; seizure d/o; DM; and dementia presenting with lethargy.  History obtained from her daughter, who was at the the bedside.  Since about 3/27, she has been having elevated glucose (270-421).  Night before last, her oxygen dropped and she needed breathing treatments.  She was running a fever then and last night and her oxygen dropped again and so they sent her to the ER.  Fever was 101+.  She does have h/o OSA, used CPAP until 2019 when she had CVA; since then, she hasn't needed O2/CPAP/breathing treatments.  Since her stroke, she is bed ridden but has "movement on both sides".  She is NPO and uses her feeding tube for all meds.  She is mostly nonverbal.  They did not mention any concerns about her urine.  Currently further plan is treat hypernatremia with free water and monitor improvement in mentation.  Assessment and Plan: 1.  Acute on metabolic encephalopathy Underlying dementia likely delirium. Hypernatremia likely from dehydration. UTI Multifactorial acute on chronic encephalopathy. At her baseline the patient is nonverbal, occasionally smiles and opens up her eyes on verbal cues.  Otherwise dependent on care. UA shows pyuria and patient is started on IV ceftriaxone. Cultures so far negative. Patient also has severe hyponatremia currently being treated with free water. Patient was started on IV fluid with IV normal saline which I will discontinue.  2.  Chronic vascular dementia. History of CVA with functional quadriplegia. Continue aspirin continue Zoloft.  3.  Essential  hypertension Continue Coreg  4.  Hyperlipidemia Continue Crestor  5.  Dysphagia. SP PEG tube placement. Continue nutrition tube feedings.  6.  Type 2 diabetes mellitus Continue Levemir and sliding scale insulin.  7.  History of seizure disorders. Continue home medications.   8.  Stage III sacral pressure ulcer. Frequent turning and dressing changes.  Pressure Injury 08/18/19 Left (Active)  08/18/19 1700  Location:   Location Orientation: Left  Staging:   Wound Description (Comments):   Present on Admission:      Pressure Injury 08/18/19 Right Stage 3 -  Full thickness tissue loss. Subcutaneous fat may be visible but bone, tendon or muscle are NOT exposed. (Active)  08/18/19 1700  Location:   Location Orientation: Right  Staging: Stage 3 -  Full thickness tissue loss. Subcutaneous fat may be visible but bone, tendon or muscle are NOT exposed.  Wound Description (Comments):   Present on Admission: Yes     Diet: Tube feeding NPO DVT Prophylaxis: Subcutaneous Lovenox   Advance goals of care discussion: DNR  Family Communication: family was present at bedside, at the time of interview.  The pt provided permission to discuss medical plan with the family. Opportunity was given to ask question and all questions were answered satisfactorily.   Disposition:  Pt is from SNF, admitted with hyponatremia and UTI, still has sodium 156, which precludes a safe discharge. Discharge to SNF, when likely tomorrow.  Subjective: No nausea no vomiting. Minimally responsive. No change in mentation is compared to before.  Physical Exam: General:  lethargic not oriented to time, place, and person.  Appear  in mild distress, affect unresponsive Eyes: PERRL ENT: Oral Mucosa Clear, dry  Neck: difficult to assess  JVD,  Cardiovascular: S1 and S2 Present, no Murmur,  Respiratory: good respiratory effort, Bilateral Air entry equal and Decreased, bilateral  Crackles, no wheezes Abdomen: Bowel  Sound present, Soft and difficult to assess tenderness,  Skin: no rash Extremities: no Pedal edema, difficult to assess  calf tenderness Neurologic: Quadriplegia, aphasia, not following commands.  Gait not checked due to patient safety concerns  Vitals:   08/19/19 1435 08/19/19 1947 08/20/19 0424 08/20/19 1452  BP: (!) 137/94 (!) 134/92 129/73 (!) 149/94  Pulse: 72 73 81 72  Resp: 16 16  19   Temp: 97.6 F (36.4 C) 98 F (36.7 C) 98.3 F (36.8 C) 97.6 F (36.4 C)  TempSrc: Axillary Oral Oral Axillary  SpO2: 99% 100% 100% 100%  Weight:      Height:        Intake/Output Summary (Last 24 hours) at 08/20/2019 1818 Last data filed at 08/20/2019 1425 Gross per 24 hour  Intake 1706.28 ml  Output 1 ml  Net 1705.28 ml   Filed Weights   08/18/19 0021 08/18/19 1436  Weight: 100 kg 85.5 kg    Data Reviewed: I have personally reviewed and interpreted daily labs, tele strips, imagings as discussed above. I reviewed all nursing notes, pharmacy notes, vitals, pertinent old records I have discussed plan of care as described above with RN and patient/family.  CBC: Recent Labs  Lab 08/18/19 0034 08/19/19 0345 08/20/19 0817  WBC 12.4* 9.0 7.3  NEUTROABS 7.1  --   --   HGB 13.3 11.7* 11.4*  HCT 44.0 39.0 37.0  MCV 98.0 98.7 94.4  PLT 229 183 786   Basic Metabolic Panel: Recent Labs  Lab 08/18/19 0034 08/19/19 0345 08/19/19 1526 08/20/19 0817  NA 155* 158* 156* 153*  K 4.0 3.4* 3.5 3.5  CL 115* 120* 121* 119*  CO2 26 25 27 25   GLUCOSE 283* 324* 329* 235*  BUN 47* 43* 38* 31*  CREATININE 0.89 0.65 0.61 0.56  CALCIUM 9.7 9.1 9.4 8.9    Studies: No results found.  Scheduled Meds: . ascorbic acid  500 mg Per Tube BID  . aspirin  81 mg Per Tube Daily  . atorvastatin  40 mg Per Tube q1600  . carvedilol  6.25 mg Per Tube BID WC  . docusate sodium  100 mg Oral BID  . enoxaparin (LOVENOX) injection  40 mg Subcutaneous Q24H  . feeding supplement (PRO-STAT SUGAR FREE 64)  30  mL Per Tube BID  . ferrous sulfate  220 mg Per Tube Daily  . free water  200 mL Per Tube Q4H  . gabapentin  100 mg Oral QHS  . hydrocerin   Topical BID  . insulin aspart  0-15 Units Subcutaneous Q4H  . insulin detemir  47 Units Subcutaneous QHS  . levETIRAcetam  1,000 mg Per Tube BID  . liver oil-zinc oxide   Topical BID  . montelukast  10 mg Per Tube QHS  . mupirocin ointment  1 application Topical TID  . sertraline  100 mg Per Tube Daily   Continuous Infusions: . cefTRIAXone (ROCEPHIN)  IV 2 g (08/20/19 0053)  . feeding supplement (JEVITY 1.5 CAL/FIBER) 1,000 mL (08/19/19 1442)   PRN Meds: acetaminophen **OR** acetaminophen, bisacodyl, hydrALAZINE, HYDROcodone-acetaminophen, hydroxypropyl methylcellulose / hypromellose, morphine injection, ondansetron **OR** ondansetron (ZOFRAN) IV, polyethylene glycol, zolpidem  Time spent: 35 minutes  Author: Berle Mull, MD [facility]  08/20/2019 6:18 PM  To reach On-call, see care teams to locate the attending and reach out to them via [email or web address]. If 7PM-7AM, please contact night-coverage If you still have difficulty reaching the attending provider, please page the [facility] (Director on Call) for [facility] on amion for assistance.

## 2019-08-20 NOTE — Consult Note (Addendum)
WOC Nurse Consult Note: Reason for Consult:Bilateral Buttocks and peri-PEG tube area erythema Wound type:Friction injury Pressure Injury POA: Yes.  Previously healed Stage 3 pressure injury on the bilateral buttocks.  Now with friction skin injury at the proximal aspect of the scarred tissue from sliding down in bed or in chair prior to admission.  Please see photo dated yesterday and placed in the EMR by the Provider. Measurement: Bilateral buttock area with previously healed full thickness ulceration (likely Stage 3) that now presents with scarring.  8.8cm x 3.8cm x 0.6cm There are scattered friction injuries that are consistent with sliding and repositioning in bed without benefit of low friction coefficient (CoF) bed linens. Wound bed:red, moist Drainage (amount, consistency, odor) scant serous Periwound: jagged, shaved appearance. White discoloration.  Dressing procedure/placement/frequency: A Wound Treatment Associate Margarita Rana, Enrigue Catena)  yesterday assessed the patient with the Provider and conferred with me immediately after for collaboration on the POC. The areas today are as noted yesterday and the POC remains in place.  I will add a wound contact layer beneath the silicone foam dressing to act as an astringent and hydrating wound interface. I will also add a thin layer of zinc oxide to the peri-PEG tube area which is erythematous, but not broken. A mattress replacement is in place in addition to our hospital-issue low CoF bed linens, Derma[employer]rapy. The time period while in house will provide relief from the constant trauma as will the silicone foam dressing.  Prevalon heel boots are protective of the heels. Despite currently on bedrest, a pressure redistribution chair pad is provided for when OOB to chair in an effort to continue the anti-friction environment for wound healing/tissue repair.  WOC nursing team will not follow, but will remain available to this patient, the nursing and medical teams.   Please re-consult if needed. Thanks, Ladona Mow, MSN, RN, GNP, [place]  Pager# [phone]

## 2019-08-21 DIAGNOSIS — N390 Urinary tract infection, site not specified: Secondary | ICD-10-CM | POA: Diagnosis not present

## 2019-08-21 LAB — BASIC METABOLIC PANEL
Anion gap: 12 (ref 5–15)
Anion gap: 10 (ref 5–15)
Anion gap: 8 (ref 5–15)
Anion gap: 11 (ref 5–15)
BUN: 27 mg/dL — ABNORMAL HIGH (ref 8–23)
BUN: 27 mg/dL — ABNORMAL HIGH (ref 8–23)
BUN: 26 mg/dL — ABNORMAL HIGH (ref 8–23)
BUN: 24 mg/dL — ABNORMAL HIGH (ref 8–23)
CO2: 23 mmol/L (ref 22–32)
CO2: 23 mmol/L (ref 22–32)
CO2: 27 mmol/L (ref 22–32)
CO2: 22 mmol/L (ref 22–32)
Calcium: 8.7 mg/dL — ABNORMAL LOW (ref 8.9–10.3)
Calcium: 8.6 mg/dL — ABNORMAL LOW (ref 8.9–10.3)
Calcium: 8.8 mg/dL — ABNORMAL LOW (ref 8.9–10.3)
Calcium: 8.7 mg/dL — ABNORMAL LOW (ref 8.9–10.3)
Chloride: 116 mmol/L — ABNORMAL HIGH (ref 98–111)
Chloride: 117 mmol/L — ABNORMAL HIGH (ref 98–111)
Chloride: 115 mmol/L — ABNORMAL HIGH (ref 98–111)
Chloride: 115 mmol/L — ABNORMAL HIGH (ref 98–111)
Creatinine, Ser: 0.48 mg/dL (ref 0.44–1.00)
Creatinine, Ser: 0.49 mg/dL (ref 0.44–1.00)
Creatinine, Ser: 0.5 mg/dL (ref 0.44–1.00)
Creatinine, Ser: 0.48 mg/dL (ref 0.44–1.00)
GFR calc Af Amer: >60 mL/min (ref >60)
GFR calc Af Amer: >60 mL/min (ref >60)
GFR calc Af Amer: >60 mL/min (ref >60)
GFR calc Af Amer: >60 mL/min (ref >60)
GFR calc non Af Amer: >60 mL/min (ref >60)
GFR calc non Af Amer: >60 mL/min (ref >60)
GFR calc non Af Amer: >60 mL/min (ref >60)
GFR calc non Af Amer: >60 mL/min (ref >60)
Glucose, Bld: 180 mg/dL — ABNORMAL HIGH (ref 70–99)
Glucose, Bld: 204 mg/dL — ABNORMAL HIGH (ref 70–99)
Glucose, Bld: 204 mg/dL — ABNORMAL HIGH (ref 70–99)
Glucose, Bld: 205 mg/dL — ABNORMAL HIGH (ref 70–99)
Potassium: 3.4 mmol/L — ABNORMAL LOW (ref 3.5–5.1)
Potassium: 3.6 mmol/L (ref 3.5–5.1)
Potassium: 3.5 mmol/L (ref 3.5–5.1)
Potassium: 3.6 mmol/L (ref 3.5–5.1)
Sodium: 151 mmol/L — ABNORMAL HIGH (ref 135–145)
Sodium: 150 mmol/L — ABNORMAL HIGH (ref 135–145)
Sodium: 150 mmol/L — ABNORMAL HIGH (ref 135–145)
Sodium: 148 mmol/L — ABNORMAL HIGH (ref 135–145)

## 2019-08-21 LAB — SARS CORONAVIRUS 2 (TAT 6-24 HRS): SARS Coronavirus 2: NEGATIVE

## 2019-08-21 LAB — GLUCOSE, CAPILLARY
Glucose-Capillary: 153 mg/dL — ABNORMAL HIGH (ref 70–99)
Glucose-Capillary: 163 mg/dL — ABNORMAL HIGH (ref 70–99)
Glucose-Capillary: 179 mg/dL — ABNORMAL HIGH (ref 70–99)
Glucose-Capillary: 185 mg/dL — ABNORMAL HIGH (ref 70–99)
Glucose-Capillary: 191 mg/dL — ABNORMAL HIGH (ref 70–99)
Glucose-Capillary: 202 mg/dL — ABNORMAL HIGH (ref 70–99)
Glucose-Capillary: 221 mg/dL — ABNORMAL HIGH (ref 70–99)

## 2019-08-21 LAB — HEMOGLOBIN A1C
Hgb A1c MFr Bld: 8.2 % — ABNORMAL HIGH (ref 4.8–5.6)
Mean Plasma Glucose: 189 mg/dL

## 2019-08-21 MED ORDER — DEXTROSE 5 % IV SOLN: INTRAVENOUS | Status: AC

## 2019-08-21 MED ORDER — GLUCERNA 1.5 CAL PO LIQD
1000.0000 mL | ORAL | Status: DC
  Filled 2019-08-21 (×3): qty 1000

## 2019-08-21 MED ORDER — OSMOLITE 1.2 CAL PO LIQD: 1000.0000 mL | ORAL | Status: DC

## 2019-08-21 MED ORDER — PRO-STAT SUGAR FREE PO LIQD
30.0000 mL | Freq: Every day | ORAL | Status: DC
  Administered 2019-08-22: 08:00:00 30 mL
  Filled 2019-08-21: qty 30

## 2019-08-21 NOTE — Progress Notes (Signed)
Initial Nutrition Assessment  DOCUMENTATION CODES:   Not applicable  INTERVENTION:   -D/c Jevity 1.5 -Initiate Glucerna 1.5 @ 55 ml/hr via PEG  30 ml Prostat daily.    200 ml free water flush every 4 hours per MD  Tube feeding regimen provides 2080 kcal (100% of needs), 124 grams of protein, and 1002 ml of H2O. Total free water: 2203  NUTRITION DIAGNOSIS:   Increased nutrient needs related to wound healing as evidenced by estimated needs.  GOAL:   Patient will meet greater than or equal to 90% of their needs  MONITOR:   Labs, Weight trends, TF tolerance, Skin, I & O's  REASON FOR ASSESSMENT:   Low [place]    ASSESSMENT:   Christine Franklin is a 69 y.o. female with medical history significant of PVD; HTN; HLD; CVA with functional quadriplegia/aphasia/nonverbal/PEG tube; seizure d/o; DM; and dementia presenting with lethargy.  History obtained from her daughter, who was at the the bedside.  Since about 3/27, she has been having elevated glucose (270-421).  Night before last, her oxygen dropped and she needed breathing treatments.  She was running a fever then and last night and her oxygen dropped again and so they sent her to the ER.  Fever was 101+.  She does have h/o OSA, used CPAP until 2019 when she had CVA; since then, she hasn't needed O2/CPAP/breathing treatments.  Since her stroke, she is bed ridden but has "movement on both sides".  She is NPO and uses her feeding tube for all meds.  She is mostly nonverbal.  They did not mention any concerns about her urine.  Pt admitted with AMS and chronic encephalopathy.  Reviewed I/O's: +1.2 L x 24 hours and +6 L since admission  Case discussed with RN prior to visit, who reports pt tolerating TF well. D5 was recently added due to hypernatremia.   Pt lying in bed, but unable to provide hisotry. Spoke with pt daughter at bedside, who confirmed pt is NPO and receives TF at baseline. Review[facility] and pt  daughter confirmed that TF regimen PTA was Glucerna 1.5 @ 55 ml/hr, infused over 20 hours (1400-1000), which provides 1650 kcals, 91 grams protein, and 834 ml free water, meeting 80% of estimated kcal needs and 83% of estimated protein needs. Daughter reports pt was on 20 hour feedings since prior to [place] pandemic, as they actively took pt outdoors and outside of facilities for day trips.  Pt currently receiving Jevity 1.5 @ 55 ml/hr with 30 ml Prostat BID, and 200 ml free water flush every 4 hours. Complete regimen provides 2180 kcals, 114 grams protein, and 2203 ml free water daily, meeting 100% of estimated kcal needs. Pt daughter requesting switch to Glucerna 1.5, as pt historically does not tolerate Jevity formula secondary to loose stools and hyperglycemia. RD reached out to MD on secure chat to obtain TF ordering privileges.   Per daughter, pt UBW is around 210# and estimates she has lost about 20-30# over the past year. She also expressed concern about muscle atrophy in upper and lower extremities. No wt hx available to confirm this statement.   Medications reviewed and include dextrose 5% solution @ 50 ml/hr.   Lab Results  Component Value Date   HGBA1C 8.2 (H) 08/19/2019   PTA DM medications are 47 units insulin detemir daily and 0.5 mg trulicity every Wednesday. Per ADA's Standards of Medical Care of Diabetes, glycemic targets for older adults who have multiple co-morbidities, cognitive  impairments, and functional dependence should be less stringent (Hgb A1c <8.0-8.5).   Labs reviewed: Na: 150, CBGS: 163-185 (inpatient orders for glycemic control are 0-15 units insulin aspart every 4 hours and 47 units insulin detemir q HS).   NUTRITION - FOCUSED PHYSICAL EXAM:    Most Recent Value  Orbital Region  No depletion  Upper Arm Region  No depletion  Thoracic and Lumbar Region  No depletion  Buccal Region  No depletion  Temple Region  No depletion  Clavicle Bone Region  No depletion   Clavicle and Acromion Bone Region  No depletion  Scapular Bone Region  No depletion  Dorsal Hand  Mild depletion  Patellar Region  Mild depletion  Anterior Thigh Region  Mild depletion  Posterior Calf Region  Mild depletion  Edema (RD Assessment)  Mild  Hair  Reviewed  Eyes  Reviewed  Mouth  Reviewed  Skin  Reviewed  Nails  Reviewed       Diet Order:   Diet Order            Diet NPO time specified  Diet effective now              EDUCATION NEEDS:   No education needs have been identified at this time  Skin:  Skin Assessment: Skin Integrity Issues: Skin Integrity Issues:: Stage III Stage III: buttocks  Last BM:  08/21/19  Height:   Ht Readings from Last 1 Encounters:  08/18/19 5\' 8"  (1.727 m)    Weight:   Wt Readings from Last 1 Encounters:  08/18/19 85.5 kg    Ideal Body Weight:  63.6 kg  BMI:  Body mass index is 28.66 kg/m.  Estimated Nutritional Needs:   Kcal:  2050-2250  Protein:  110-125 grams  Fluid:  > 2.1 L    12-11-1978, RD, LDN, CDCES Registered Dietitian II Certified Diabetes Care and Education Specialist Please refer to [facility] for RD and/or RD on-call/weekend/after hours pager

## 2019-08-21 NOTE — Evaluation (Signed)
Physical Therapy Evaluation Patient Details Name: Christine Franklin MRN: [id number] DOB: [birth year] Today's Date: 08/21/2019   History of Present Illness  Christine Franklin is a 69 y.o. female with medical history significant of PVD; HTN; HLD; CVA with functional quadriplegia/aphasia/nonverbal/PEG tube; seizure d/o; DM; and dementia presenting with lethargy. Dx with UTI and acute encephalopathy.   Clinical Impression  Pt admitted with above diagnosis. Pt presents with increased tone and decreased mobility since past year of covid and family not being allowed into SNF to work with her. Prior to covid pt was being lifted to chair daily as well but daughter reports she does not think that has been happening and mother's ROM does not seem like she would tolerate it at this point. It is her goal for mom to be able to tolerate sitting in her chair again. We will trial therapy with that goal. Pt currently with functional limitations due to the deficits listed below (see PT Problem List). Pt will benefit from skilled PT to increase their independence and safety with mobility to allow discharge to the venue listed below.       Follow Up Recommendations SNF;Supervision/Assistance - 24 hour    Equipment Recommendations  None recommended by PT    Recommendations for Other Services       Precautions / Restrictions Precautions Precautions: Fall Precaution Comments: strong extensor tone Restrictions Weight Bearing Restrictions: No Other Position/Activity Restrictions: PEG      Mobility  Bed Mobility Overal bed mobility: Needs Assistance Bed Mobility: Rolling Rolling: Max assist         General bed mobility comments: rolled both directions multiple times for bowel/ bladder clean up. Tot A to R, max A to L  Transfers                 General transfer comment: dependent for transfers  Ambulation/Gait             General Gait Details: unable  Stairs            Wheelchair  Mobility    Modified Rankin (Stroke Patients Only)       Balance Overall balance assessment: Needs assistance   Sitting balance-Leahy Scale: Zero       Standing balance-Leahy Scale: Zero                               Pertinent Vitals/Pain Pain Assessment: Faces Faces Pain Scale: Hurts even more Pain Location: buttocks with cleaning Pain Descriptors / Indicators: Sore;Tender Pain Intervention(s): Limited activity within patient's tolerance;Monitored during session    Home Living Family/patient expects to be discharged to:: Skilled nursing facility                 Additional Comments: dtr was going to SNF daily and helping pt with ROM and pt was getting to chair with lift each day before covid, nothing has been happening since covid (>1 yr)    Prior Function Level of Independence: Needs assistance   Gait / Transfers Assistance Needed: dependent for transfer to w/c  ADL's / Homemaking Assistance Needed: dependent for bathing and dressing        Hand Dominance   Dominant Hand: Right    Extremity/Trunk Assessment   Upper Extremity Assessment Upper Extremity Assessment: Difficult to assess due to impaired cognition    Lower Extremity Assessment Lower Extremity Assessment: RLE deficits/detail;LLE deficits/detail;Difficult to assess due to impaired cognition RLE Deficits / Details: extensor  tone noted R>L, would have difficulty bending knees enough to sit in chair at this point RLE Sensation: decreased light touch;decreased proprioception RLE Coordination: decreased gross motor;decreased fine motor LLE Deficits / Details: extensor tone LLE Sensation: decreased light touch;decreased proprioception LLE Coordination: decreased fine motor;decreased gross motor       Communication   Communication: Expressive difficulties;Receptive difficulties  Cognition Arousal/Alertness: Awake/alert Behavior During Therapy: Flat affect Overall Cognitive Status:  History of cognitive impairments - at baseline                                 General Comments: pt with eyes opened most of session, follows simple commands 25% of time      General Comments General comments (skin integrity, edema, etc.): discussed goals with daughter, she would like mom to be able to tolerate sitting in chair again but mom has become so stiff unsure that she will be able to do that    Exercises General Exercises - Lower Extremity Ankle Circles/Pumps: PROM;Both;10 reps;Supine Heel Slides: PROM;Both;10 reps;Supine Straight Leg Raises: PROM;Both;10 reps;Supine   Assessment/Plan    PT Assessment Patient needs continued PT services  PT Problem List Decreased strength;Impaired tone;Impaired sensation;Decreased cognition;Decreased coordination;Decreased mobility;Decreased range of motion;Decreased balance;Decreased skin integrity;Pain       PT Treatment Interventions Functional mobility training;Therapeutic activities;Therapeutic exercise;Balance training;Neuromuscular re-education;Patient/family education    PT Goals (Current goals can be found in the Care Plan section)  Acute Rehab PT Goals Patient Stated Goal: sit in chair (daughter) PT Goal Formulation: With patient/family Time For Goal Achievement: 09/04/19 Potential to Achieve Goals: Fair    Frequency Min 2X/week   Barriers to discharge        Franklin-evaluation               AM-PAC PT "6 Clicks" Mobility  Outcome Measure Help needed turning from your back to your side while in a flat bed without using bedrails?: Total Help needed moving from lying on your back to sitting on the side of a flat bed without using bedrails?: Total Help needed moving to and from a bed to a chair (including a wheelchair)?: Total Help needed standing up from a chair using your arms (e.g., wheelchair or bedside chair)?: Total Help needed to walk in hospital room?: Total Help needed climbing 3-5 steps with a  railing? 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

## 2019-08-21 NOTE — Progress Notes (Signed)
Inpatient Diabetes Program Recommendations  AACE/ADA: New Consensus Statement on Inpatient Glycemic Control (2015)  Target Ranges:  Prepandial:   less than 140 mg/dL      Peak postprandial:   less than 180 mg/dL (1-2 hours)      Critically ill patients:  140 - 180 mg/dL   Lab Results  Component Value Date   GLUCAP 185 (H) 08/21/2019   HGBA1C 8.2 (H) 08/19/2019    Review of Glycemic Control Results for Christine Franklin, Christine Franklin (MRN [id number]) as of 08/21/2019 12:46  Ref. Range 08/20/2019 19:57 08/21/2019 00:19 08/21/2019 04:19 08/21/2019 07:39 08/21/2019 12:11  Glucose-Capillary Latest Ref Range: 70 - 99 mg/dL 934 (H) 068 (H) 403 (H) 163 (H) 185 (H)   Diabetes history: Type 2 DM Outpatient Diabetes medications: Trulicity 0.5 mg Qwk,  Levemir 47 units QHS,  Current orders for Inpatient glycemic control: Levemir 47 units QHS, Novolog 0-15 units Q4H Jevity 1.5 cal @ 55 ml/hr Inpatient Diabetes Program Recommendations:    Consider increasing Levemir to 52 units QHS.   Thanks, Lujean Rave, MSN, RNC-OB Diabetes Coordinator [phone] (8a-5p)

## 2019-08-21 NOTE — TOC Initial Note (Addendum)
Transition of Care [facility]) - Initial/Assessment Note    Patient Details  Name: Christine Franklin MRN: [id number] Date of Birth: [birth year]  Transition of Care [facility]) CM/SW Contact:    Kingsley Plan, RN Phone Number: 08/21/2019, 1:08 PM  Clinical Narrative:          Update Daughter has talked to the adm at [facility] and she has decided to let her mother go back to [facility] at discharge. She has the Medicare.gov list of SNF's and will start looking for long term placement at another SNF.          Discussed disposition with patient's daughter Valente David at bedside. Patient is from [facility]. Dynita willing for her mother to go back to [facility], however she would like to know her options. She has already talked to Social Services they will pay [facility] until the end of the month.   Will complete FL2 and fax.  Expected Discharge Plan: Skilled Nursing Facility Barriers to Discharge: Continued Medical Work up   Patient Goals and CMS Choice Patient states their goals for this hospitalization and ongoing recovery are:: to return to SNF CMS Medicare.gov Compare Post Acute Care list provided to:: (daughter Valente David) Choice offered to / list presented to : Adult Children  Expected Discharge Plan and Services Expected Discharge Plan: Skilled Nursing Facility   Discharge Planning Services: CM Consult Post Acute Care Choice: Skilled Nursing Facility Living arrangements for the past 2 months: Skilled Nursing Facility                   DME Agency: NA       HH Arranged: NA          Prior Living Arrangements/Services Living arrangements for the past 2 months: Skilled Nursing Facility   Patient language and need for interpreter reviewed:: Yes Do you feel safe going back to the place where you live?: Yes      Need for Family Participation in Patient Care: Yes (Comment) Care giver support system in place?: Yes (comment)   Criminal Activity/Legal Involvement  Pertinent to Current Situation/Hospitalization: No - Comment as needed  Activities of Daily Living      Permission Sought/Granted   Permission granted to share information with : Yes, Verbal Permission Granted  Share Information with NAME: Dynita Stuart           Emotional Assessment Appearance:: Appears stated age       Alcohol / Substance Use: Not Applicable Psych Involvement: No (comment)  Admission diagnosis:  Hypernatremia [E87.0] UTI (urinary tract infection) [N39.0] Urinary tract infection without hematuria, site unspecified [N39.0] Sepsis, due to unspecified organism, unspecified whether acute organ dysfunction present [facility]) [A41.9] Patient Active Problem List   Diagnosis Date Noted  . Pressure injury of skin 08/19/2019  . UTI (urinary tract infection) 08/18/2019  . History of CVA (cerebrovascular accident) 08/18/2019  . Functional quadriplegia (HCC) 08/18/2019  . Dehydration with hypernatremia 08/18/2019  . Epilepsy (HCC)   . Diabetes mellitus without complication (HCC)   . Dysphagia   . Hyperlipidemia   . Hypertensive heart disease   . Cerebral thrombosis with cerebral infarction 09/06/2017  . Generalized tonic-clonic seizure (HCC) 09/03/2017  . 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 Myoclonic jerking 09/03/2017  . Normocytic anemia 09/03/2017  . Acute encephalopathy 09/02/2017   PCP:  [name], MD Pharmacy:  No Pharmacies Listed    Social Determinants of Health (SDOH) Interventions    Readmission Risk Interventions No  flowsheet data found.

## 2019-08-21 NOTE — NC FL2 (Signed)
[place] MEDICAID FL2 LEVEL OF CARE SCREENING TOOL     IDENTIFICATION  Patient Name: Christine Franklin Birthdate: 07-05-1950 Sex: female Admission Date (Current Location): 08/18/2019  [facility] and [place] Number:  Producer, television/film/video and Address:  The . [facility], [address] [place], [place] [address]      Provider Number: [id number]  Attending Physician Name and Address:  Rolly Salter, MD  Relative Name and Phone Number:  Daughter Valente David [phone]    Current Level of Care: Hospital Recommended Level of Care: Skilled Nursing Facility Prior Approval Number:    Date Approved/Denied:   PASRR Number: [id number]  Discharge Plan: SNF    Current Diagnoses: Patient Active Problem List   Diagnosis Date Noted  . Pressure injury of skin 08/19/2019  . UTI (urinary tract infection) 08/18/2019  . History of CVA (cerebrovascular accident) 08/18/2019  . Functional quadriplegia (HCC) 08/18/2019  . Dehydration with hypernatremia 08/18/2019  . Epilepsy (HCC)   . Diabetes mellitus without complication (HCC)   . Dysphagia   . Hyperlipidemia   . Hypertensive heart disease   . Cerebral thrombosis with cerebral infarction 09/06/2017  . Generalized tonic-clonic seizure (HCC) 09/03/2017  . 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 Myoclonic jerking 09/03/2017  . Normocytic anemia 09/03/2017  . Acute encephalopathy 09/02/2017    Orientation RESPIRATION BLADDER Height & Weight     (Dementia, non verbal quadriplegia)  Normal Incontinent Weight: 85.5 kg Height:  5\' 8"  (172.7 cm)  BEHAVIORAL SYMPTOMS/MOOD NEUROLOGICAL BOWEL NUTRITION STATUS    Convulsions/Seizures Incontinent Feeding tube(Jevity 1.5 cal 55cc/hr continuous)  AMBULATORY STATUS COMMUNICATION OF NEEDS Skin   Total Care Does not communicate (Stage 3 pressure injury buttocks, xerfoam guaze dry guaze then foam dressing daily and PRN)                       Personal Care Assistance Level of  Assistance  Bathing, Feeding, Dressing, Total care Bathing Assistance: Maximum assistance Feeding assistance: Maximum assistance(Tube feeding) Dressing Assistance: Maximum assistance Total Care Assistance: Maximum assistance   Functional Limitations Info  Speech(nonverbal)     Speech Info: Impaired(non verbal)    SPECIAL CARE FACTORS FREQUENCY                       Contractures Contractures Info: Not present    Additional Factors Info  Code Status, Allergies, Insulin Sliding Scale Code Status Info: DNR Allergies Info: ace inhibitor, contrast media, dust miteextract grapefruit extract lambs quarters, pcn, shellfish allergy   Insulin Sliding Scale Info: Novolg 0 to 15 units , Levemir 47 units SQ at bedtime,       Current Medications (08/21/2019):  This is the current hospital active medication list Current Facility-Administered Medications  Medication Dose Route Frequency Provider Last Rate Last Admin  . acetaminophen (TYLENOL) tablet 650 mg  650 mg Oral Q6H PRN 10/21/2019, MD       Or  . acetaminophen (TYLENOL) suppository 650 mg  650 mg Rectal Q6H PRN Jonah Blue, MD      . ascorbic acid (VITAMIN C) tablet 500 mg  500 mg Per Tube BID Jonah Blue, MD   500 mg at 08/21/19 0850  . aspirin chewable tablet 81 mg  81 mg Per Tube Daily 10/21/19, MD   81 mg at 08/21/19 0849  . atorvastatin (LIPITOR) tablet 40 mg  40 mg Per Tube q1600 10/21/19, MD   40 mg at  08/20/19 1824  . bisacodyl (DULCOLAX) EC tablet 5 mg  5 mg Oral Daily PRN Karmen Bongo, MD      . carvedilol (COREG) tablet 6.25 mg  6.25 mg Per Tube BID WC Karmen Bongo, MD   6.25 mg at 08/21/19 0850  . cefTRIAXone (ROCEPHIN) 2 g in sodium chloride 0.9 % 100 mL IVPB  2 g Intravenous Q24H Karmen Bongo, MD 200 mL/hr at 08/21/19 0039 2 g at 08/21/19 0039  . dextrose 5 % solution   Intravenous Continuous Lavina Hamman, MD 50 mL/hr at 08/21/19 1250 New Bag at 08/21/19 1250  . enoxaparin  (LOVENOX) injection 40 mg  40 mg Subcutaneous Q24H Karmen Bongo, MD   40 mg at 08/21/19 1252  . feeding supplement (JEVITY 1.5 CAL/FIBER) liquid 1,000 mL  1,000 mL Per Tube Continuous Karmen Bongo, MD 55 mL/hr at 08/19/19 1442 1,000 mL at 08/19/19 1442  . feeding supplement (PRO-STAT SUGAR FREE 64) liquid 30 mL  30 mL Per Tube BID Karmen Bongo, MD   30 mL at 08/21/19 0849  . ferrous sulfate 220 (44 Fe) MG/5ML solution 220 mg  220 mg Per Tube Daily Karmen Bongo, MD   220 mg at 08/21/19 0849  . free water 200 mL  200 mL Per Tube Q4H Lavina Hamman, MD   200 mL at 08/21/19 1252  . gabapentin (NEURONTIN) capsule 100 mg  100 mg Oral Ivery Quale, MD   100 mg at 08/20/19 2106  . hydrALAZINE (APRESOLINE) injection 5 mg  5 mg Intravenous Q4H PRN Karmen Bongo, MD      . hydrocerin (EUCERIN) cream   Topical BID Lavina Hamman, MD   Given at 08/21/19 [phone]  . HYDROcodone-acetaminophen (NORCO/VICODIN) 5-325 MG per tablet 1-2 tablet  1-2 tablet Per Tube Q4H PRN Lavina Hamman, MD   1 tablet at 08/20/19 2109  . hydroxypropyl methylcellulose / hypromellose (ISOPTO TEARS / GONIOVISC) 2.5 % ophthalmic solution 1 drop  1 drop Both Eyes QID PRN Karmen Bongo, MD      . insulin aspart (novoLOG) injection 0-15 Units  0-15 Units Subcutaneous Q4H Sharion Settler, NP   3 Units at 08/21/19 1249  . insulin detemir (LEVEMIR) injection 47 Units  47 Units Subcutaneous QHS Karmen Bongo, MD   47 Units at 08/20/19 2108  . levETIRAcetam (KEPPRA) 100 MG/ML solution 1,000 mg  1,000 mg Per Tube BID Karmen Bongo, MD   1,000 mg at 08/21/19 0849  . liver oil-zinc oxide (DESITIN) 40 % ointment   Topical BID Jamse Arn, MD   Given at 08/21/19 0848  . montelukast (SINGULAIR) tablet 10 mg  10 mg Per Tube Ivery Quale, MD   10 mg at 08/20/19 2106  . morphine 2 MG/ML injection 2 mg  2 mg Intravenous Q2H PRN Karmen Bongo, MD      . mupirocin ointment (BACTROBAN) 2 % 1 application  1 application  Topical TID Karmen Bongo, MD   1 application at 04/28/74 0848  . ondansetron (ZOFRAN) tablet 4 mg  4 mg Oral Q6H PRN Karmen Bongo, MD       Or  . ondansetron [facility]) injection 4 mg  4 mg Intravenous Q6H PRN Karmen Bongo, MD      . polyethylene glycol (MIRALAX / GLYCOLAX) packet 17 g  17 g Oral Daily PRN Karmen Bongo, MD      . sertraline (ZOLOFT) tablet 100 mg  100 mg Per Tube Daily Karmen Bongo, MD   100 mg  at 08/21/19 0851  . zolpidem (AMBIEN) tablet 5 mg  5 mg Oral QHS PRN Jonah Blue, MD         Discharge Medications: Please see discharge summary for a list of discharge medications.  Relevant Imaging Results:  Relevant Lab Results:   Additional Information SSI [phone] long term placement  Yuliet Needs, Adria Devon, RN

## 2019-08-21 NOTE — Progress Notes (Signed)
[facility] Progress Note  Patient: Christine Franklin    FIE:[id number]  DOA: 08/18/2019     Date of Service: the patient was seen and examined on 08/21/2019  Chief Complaint  Patient presents with  . Altered Mental Status    Lethargic   Brief hospital course: Christine Franklin is a 69 y.o. female with medical history significant of PVD; HTN; HLD; CVA with functional quadriplegia/aphasia/nonverbal/PEG tube; seizure d/o; DM; and dementia presenting with lethargy.  History obtained from her daughter, who was at the the bedside.  Since about 3/27, she has been having elevated glucose (270-421).  Night before last, her oxygen dropped and she needed breathing treatments.  She was running a fever then and last night and her oxygen dropped again and so they sent her to the ER.  Fever was 101+.  She does have h/o OSA, used CPAP until 2019 when she had CVA; since then, she hasn't needed O2/CPAP/breathing treatments.  Since her stroke, she is bed ridden but has "movement on both sides".  She is NPO and uses her feeding tube for all meds.  She is mostly nonverbal.  They did not mention any concerns about her urine.  Currently further plan is treat hypernatremia with free water and monitor improvement in mentation.  Assessment and Plan: 1.  Acute on metabolic encephalopathy Underlying dementia likely delirium. Hypernatremia likely from dehydration. UTI Multifactorial acute on chronic encephalopathy. At her baseline the patient is nonverbal, occasionally smiles and opens up her eyes on verbal cues.  Otherwise dependent on care. UA shows pyuria and patient is started on IV ceftriaxone. Cultures so far negative. Patient also has severe hyponatremia currently being treated with free water. Starting her on IV D5 and monitor..  2.  Chronic vascular dementia. History of CVA with functional quadriplegia. Continue aspirin continue Zoloft.  3.  Essential hypertension Continue Coreg  4.   Hyperlipidemia Continue Crestor  5.  Dysphagia. SP PEG tube placement. Continue nutrition tube feedings.  6.  Type 2 diabetes mellitus Continue Levemir and sliding scale insulin.  7.  History of seizure disorders. Continue home medications.   8.  Stage III sacral pressure ulcer. Frequent turning and dressing changes.  Pressure Injury 08/18/19 Left (Active)  08/18/19 1700  Location:   Location Orientation: Left  Staging:   Wound Description (Comments):   Present on Admission:      Pressure Injury 08/18/19 Right Stage 3 -  Full thickness tissue loss. Subcutaneous fat may be visible but bone, tendon or muscle are NOT exposed. (Active)  08/18/19 1700  Location:   Location Orientation: Right  Staging: Stage 3 -  Full thickness tissue loss. Subcutaneous fat may be visible but bone, tendon or muscle are NOT exposed.  Wound Description (Comments):   Present on Admission: Yes     Diet: Tube feeding NPO DVT Prophylaxis: Subcutaneous Lovenox   Advance goals of care discussion: DNR  Family Communication: family was present at bedside, at the time of interview.  The pt provided permission to discuss medical plan with the family. Opportunity was given to ask question and all questions were answered satisfactorily.   Disposition:  Pt is from SNF, admitted with hyponatremia and UTI, still has sodium 156, which precludes a safe discharge. Discharge to SNF, when sodium is somewhat better.  Subjective: No acute event.  No nausea no vomiting.  Tolerating tube feeding.  Physical Exam: General:  lethargic not oriented to time, place, and person.  Appear in mild distress, affect unresponsive Eyes: PERRL  ENT: Oral Mucosa Clear, dry  Neck: difficult to assess  JVD,  Cardiovascular: S1 and S2 Present, no Murmur,  Respiratory: good respiratory effort, Bilateral Air entry equal and Decreased, bilateral  Crackles, no wheezes Abdomen: Bowel Sound present, Soft and difficult to assess  tenderness,  Skin: no rash Extremities: no Pedal edema, difficult to assess  calf tenderness Neurologic: Quadriplegia, aphasia, not following commands.  Gait not checked due to patient safety concerns  Vitals:   08/20/19 1452 08/20/19 2157 08/21/19 0423 08/21/19 1413  BP: (!) 149/94 (!) 148/88 104/60 140/90  Pulse: 72 68 74 71  Resp: 19 20 18 16   Temp: 97.6 F (36.4 C) 98.4 F (36.9 C) (!) 97.4 F (36.3 C) 97.8 F (36.6 C)  TempSrc: Axillary Oral Oral Oral  SpO2: 100% 100% 99% 100%  Weight:      Height:        Intake/Output Summary (Last 24 hours) at 08/21/2019 2020 Last data filed at 08/21/2019 1700 Gross per 24 hour  Intake 100 ml  Output --  Net 100 ml   Filed Weights   08/18/19 0021 08/18/19 1436  Weight: 100 kg 85.5 kg    Data Reviewed: I have personally reviewed and interpreted daily labs, tele strips, imagings as discussed above. I reviewed all nursing notes, pharmacy notes, vitals, pertinent old records I have discussed plan of care as described above with RN and patient/family.  CBC: Recent Labs  Lab 08/18/19 0034 08/19/19 0345 08/20/19 0817  WBC 12.4* 9.0 7.3  NEUTROABS 7.1  --   --   HGB 13.3 11.7* 11.4*  HCT 44.0 39.0 37.0  MCV 98.0 98.7 94.4  PLT 229 183 155   Basic Metabolic Panel: Recent Labs  Lab 08/19/19 1526 08/20/19 0817 08/21/19 0718 08/21/19 1227 08/21/19 1533  NA 156* 153* 151* 150* 150*  K 3.5 3.5 3.4* 3.6 3.5  CL 121* 119* 116* 117* 115*  CO2 27 25 23 23 27   GLUCOSE 329* 235* 180* 204* 204*  BUN 38* 31* 27* 27* 26*  CREATININE 0.61 0.56 0.48 0.49 0.50  CALCIUM 9.4 8.9 8.7* 8.6* 8.8*    Studies: No results found.  Scheduled Meds: . ascorbic acid  500 mg Per Tube BID  . aspirin  81 mg Per Tube Daily  . atorvastatin  40 mg Per Tube q1600  . carvedilol  6.25 mg Per Tube BID WC  . enoxaparin (LOVENOX) injection  40 mg Subcutaneous Q24H  . [START ON 08/22/2019] feeding supplement (PRO-STAT SUGAR FREE 64)  30 mL Per Tube Daily   . ferrous sulfate  220 mg Per Tube Daily  . free water  200 mL Per Tube Q4H  . gabapentin  100 mg Oral QHS  . hydrocerin   Topical BID  . insulin aspart  0-15 Units Subcutaneous Q4H  . insulin detemir  47 Units Subcutaneous QHS  . levETIRAcetam  1,000 mg Per Tube BID  . liver oil-zinc oxide   Topical BID  . montelukast  10 mg Per Tube QHS  . mupirocin ointment  1 application Topical TID  . sertraline  100 mg Per Tube Daily   Continuous Infusions: . cefTRIAXone (ROCEPHIN)  IV 2 g (08/21/19 0039)  . dextrose 50 mL/hr at 08/21/19 1250  . feeding supplement (GLUCERNA 1.5 CAL)     PRN Meds: acetaminophen **OR** acetaminophen, bisacodyl, hydrALAZINE, HYDROcodone-acetaminophen, hydroxypropyl methylcellulose / hypromellose, morphine injection, ondansetron **OR** ondansetron (ZOFRAN) IV, polyethylene glycol, zolpidem  Time spent: 35 minutes  Author: 10/21/19, MD  [facility] 08/21/2019 8:20 PM  To reach On-call, see care teams to locate the attending and reach out to them via [email or web address]. If 7PM-7AM, please contact night-coverage If you still have difficulty reaching the attending provider, please page the [facility] (Director on Call) for [facility] on amion for assistance.

## 2019-08-22 DIAGNOSIS — A419 Sepsis, unspecified organism: Secondary | ICD-10-CM | POA: Diagnosis not present

## 2019-08-22 LAB — BASIC METABOLIC PANEL
Anion gap: 9 (ref 5–15)
BUN: 22 mg/dL (ref 8–23)
CO2: 23 mmol/L (ref 22–32)
Calcium: 9 mg/dL (ref 8.9–10.3)
Chloride: 112 mmol/L — ABNORMAL HIGH (ref 98–111)
Creatinine, Ser: 0.43 mg/dL — ABNORMAL LOW (ref 0.44–1.00)
GFR calc Af Amer: >60 mL/min (ref >60)
GFR calc non Af Amer: >60 mL/min (ref >60)
Glucose, Bld: 150 mg/dL — ABNORMAL HIGH (ref 70–99)
Potassium: 3.7 mmol/L (ref 3.5–5.1)
Sodium: 144 mmol/L (ref 135–145)

## 2019-08-22 LAB — CBC
HCT: 39.2 % (ref 36.0–46.0)
Hemoglobin: 11.8 g/dL — ABNORMAL LOW (ref 12.0–15.0)
MCH: 29.2 pg (ref 26.0–34.0)
MCHC: 30.1 g/dL (ref 30.0–36.0)
MCV: 97 fL (ref 80.0–100.0)
Platelets: 156 10*3/uL (ref 150–400)
RBC: 4.04 MIL/uL (ref 3.87–5.11)
RDW: 13.5 % (ref 11.5–15.5)
WBC: 8.7 10*3/uL (ref 4.0–10.5)
nRBC: 0 % (ref 0.0–0.2)

## 2019-08-22 LAB — GLUCOSE, CAPILLARY
Glucose-Capillary: 120 mg/dL — ABNORMAL HIGH (ref 70–99)
Glucose-Capillary: 131 mg/dL — ABNORMAL HIGH (ref 70–99)

## 2019-08-22 MED ORDER — BUTALBITAL-APAP-CAFFEINE 50-300-40 MG PO CAPS: 1.0000 | ORAL_CAPSULE | Freq: Three times a day (TID) | ORAL | 0 refills | Status: AC | PRN

## 2019-08-22 MED ORDER — FREE WATER: 200.0000 mL | Freq: Four times a day (QID) | 0 refills | Status: AC

## 2019-08-22 MED ORDER — HYDROCODONE-ACETAMINOPHEN 5-325 MG PO TABS: 1.0000 | ORAL_TABLET | Freq: Two times a day (BID) | ORAL | 0 refills | Status: AC | PRN

## 2019-08-22 NOTE — Care Management (Signed)
Spoke to Hen[place] at [facility], patient can return today, last covid was 08/20/19 , no new covid needed. Daughter at bedside and aware.   Room 201a number for nurse to call report to is [phone].  Ronny Flurry RN

## 2019-08-22 NOTE — Progress Notes (Signed)
Rodneshia Lessner to be D/C'd per MD order. Discussed with the patient and all questions fully answered. ? VSS, Skin clean, dry and intact without evidence of new skin break down, no evidence of skin tears noted. ? IV catheters discontinued intact. Site without signs and symptoms of complications. Dressing and pressure applied. ? An After Visit Summary was printed and placed in discharge packet for receiving facility.   ? Patient to be escorted via stretcher, and D/C to [facility] via [facility].

## 2019-08-22 NOTE — Discharge Summary (Addendum)
[facility] Discharge Summary   Patient: Christine Franklin KAJ:[id number]  PCP: Benito Mccreedy, MD  Date of admission: 08/18/2019   Date of discharge:  08/22/2019     Discharge Diagnoses:  Principal diagnosis Hypernatremia   Active Problems:   Acute encephalopathy   Generalized tonic-clonic seizure ([place])   UTI (urinary tract infection)   History of CVA (cerebrovascular accident)   Functional quadriplegia ([place])   Epilepsy ([place])   Diabetes mellitus without complication ([place])   Dysphagia   Hyperlipidemia   Hypertensive heart disease   Dehydration with hypernatremia   Pressure injury of skin   Admitted From: SNF Disposition:  SNF   Recommendations for Outpatient Follow-up:  1. PCP: please follow up with PCP in 1 week 2. Follow up LABS/TEST:  BMP in 1 week  Follow-up Information    Osei-Bonsu, Iona Beard, MD. Schedule an appointment as soon as possible for a visit in 1 week(s).   Specialty: Internal Medicine Contact information: [address] [place] [address] [phone]          Diet recommendation: NPO on tube feeds  Activity: The patient is advised to gradually reintroduce usual activities, as tolerated  Discharge Condition: stable  Code Status: DNR   History of present illness: As per the H and P dictated on admission, "Christine Franklin is a 69 y.o. female with medical history significant of PVD; HTN; HLD; CVA with functional quadriplegia/aphasia/nonverbal/PEG tube; seizure d/o; DM; and dementia presenting with lethargy.  History obtained from her daughter, who was at the the bedside.  Since about 3/27, she has been having elevated glucose (270-421).  Night before last, her oxygen dropped and she needed breathing treatments.  She was running a fever then and last night and her oxygen dropped again and so they sent her to the ER.  Fever was 101+.  She does have h/o OSA, used CPAP until 2019 when she had CVA; since then, she hasn't needed  O2/CPAP/breathing treatments.  Since her stroke, she is bed ridden but has "movement on both sides".  She is NPO and uses her feeding tube for all meds.  She is mostly nonverbal.  They did not mention any concerns about her urine."  Hospital Course:  Summary of her active problems in the hospital is as following.   1.  Acute on metabolic encephalopathy Underlying dementia likely delirium. Hypernatremia likely from dehydration. UTI Multifactorial acute on chronic encephalopathy. At her baseline the patient is nonverbal, occasionally smiles and opens up her eyes on verbal cues.  Otherwise dependent on care. UA shows pyuria and patient is started on IV ceftriaxone. Cultures so far negative. Patient also has severe hyponatremia currently being treated with free water. Continue q6 200 ml. Assess for volume overload.   2.  Chronic vascular dementia. History of CVA with functional quadriplegia. Continue aspirin continue Zoloft.  3.  Essential hypertension Continue Coreg  4.  Hyperlipidemia Continue Crestor  5.  Dysphagia. SP PEG tube placement. Continue nutrition tube feedings.  6.  Type 2 diabetes mellitus Continue Levemir and sliding scale insulin.  7.  History of seizure disorders. Continue home medications.   8.  Stage III sacral pressure ulcer. Frequent turning and dressing changes.  9. UTI Treated with 5 days of ceftriaxone Culture grew multiple species blood culture negative  Body mass index is 28.66 kg/m.  Nutrition Problem: Increased nutrient needs Etiology: wound healing Nutrition Interventions: Interventions: Tube feeding, TPN  Pressure Injury 08/18/19 Left (Active)  08/18/19 1700  Location:  Location Orientation: Left  Staging:   Wound Description (Comments):   Present on Admission:      Pressure Injury 08/18/19 Right Stage 3 -  Full thickness tissue loss. Subcutaneous fat may be visible but bone, tendon or muscle are NOT exposed. (Active)    08/18/19 1700  Location:   Location Orientation: Right  Staging: Stage 3 -  Full thickness tissue loss. Subcutaneous fat may be visible but bone, tendon or muscle are NOT exposed.  Wound Description (Comments):   Present on Admission: Yes    Pain control related to Pressure ulcer - [facility] Controlled Substance Reporting System database was reviewed. - 5 day supply was provided. - Patient was instructed, not to drive, operate heavy machinery, perform activities at heights, swimming or participation in water activities or provide baby sitting services while on Pain, Sleep and Anxiety Medications; until her outpatient Physician has advised to do so again.  - Also recommended to not to take more than prescribed Pain, Sleep and Anxiety Medications.  On the day of the discharge the patient's vitals were stable, and no other acute medical condition were reported by patient. the patient was felt safe to be discharge at SNF with SNF.  Consultants: none Procedures: none  Discharge Exam: General: Appear in no distress, no Rash; Oral Mucosa Clear, moist. Cardiovascular: S1 and S2 Present, no Murmur, Respiratory: normal respiratory effort, Bilateral Air entry present and no Crackles, no wheezes Abdomen: Bowel Sound present, Soft and no tenderness, no hernia Extremities: no Pedal edema, no calf tenderness Neurology: alert and not oriented to time, place, and person affect nonveral.  Filed Weights   08/18/19 0021 08/18/19 1436  Weight: 100 kg 85.5 kg   Vitals:   08/21/19 2022 08/22/19 0612  BP: (!) 147/84 (!) 117/92  Pulse: 61 74  Resp: 16 15  Temp: 97.7 F (36.5 C) (!) 97.5 F (36.4 C)  SpO2: 100% 100%    DISCHARGE MEDICATION: Allergies as of 08/22/2019      Reactions   Ace Inhibitors    Contrast Media [iodinated Diagnostic Agents]    Dust Mite Extract    Grapefruit Extract    Lambs Quarters    Penicillins    Shellfish Allergy       Medication List    TAKE these  medications   acetaminophen 325 MG tablet Commonly known as: TYLENOL Place 650 mg into feeding tube 2 (two) times daily as needed for moderate pain.   ascorbic acid 500 MG tablet Commonly known as: VITAMIN C Place 500 mg into feeding tube 2 (two) times daily.   aspirin 81 MG chewable tablet Place 81 mg into feeding tube daily.   atorvastatin 40 MG tablet Commonly known as: LIPITOR Take 1 tablet (40 mg total) by mouth daily at 6 PM. What changed:   how to take this  when to take this   Butalbital-APAP-Caffeine 50-300-40 MG Caps Place 1 capsule into feeding tube 3 (three) times daily as needed ([place]ig[place]).   carvedilol 6.25 MG tablet Commonly known as: COREG Place 1 tablet (6.25 mg total) into feeding tube 2 (two) times daily with a meal.   feeding supplement (JEVITY 1.5 CAL) Liqd Place 1,000 mLs into feeding tube continuous.   feeding supplement (PRO-STAT SUGAR FREE 64) Liqd Place 30 mLs into feeding tube in the morning and at bedtime.   ferrous sulfate 220 (44 Fe) MG/5ML solution Place 220 mg into feeding tube daily.   free water Soln Place 200 mLs into feeding tube  every 6 (six) hours.   gabapentin 100 MG capsule Commonly known as: NEURONTIN 100 mg at bedtime. Via tube   Glucagon Emergency 1 MG Kit Inject 1 mg into the muscle every 8 (eight) hours as needed (blood sugar less than 60).   HYDROcodone-acetaminophen 5-325 MG tablet Commonly known as: NORCO/VICODIN Place 1 tablet into feeding tube 2 (two) times daily as needed for moderate pain.   hydroxypropyl methylcellulose / hypromellose 2.5 % ophthalmic solution Commonly known as: ISOPTO TEARS / GONIOVISC Place 1 drop into both eyes 4 (four) times daily as needed for dry eyes.   insulin aspart cartridge Commonly known as: NOVOLOG Inject 8 Units into the skin once. For hyperglycemia   insulin detemir 100 UNIT/ML injection Commonly known as: LEVEMIR Inject 47 Units into the skin at bedtime.   levETIRAcetam  100 MG/ML solution Commonly known as: KEPPRA Place 10 mLs (1,000 mg total) into feeding tube 2 (two) times daily.   montelukast 10 MG tablet Commonly known as: SINGULAIR Place 10 mg into feeding tube at bedtime.   mupirocin ointment 2 % Commonly known as: BACTROBAN Apply 1 application topically 3 (three) times daily. To G tube site. Cover with dry dressing.   nystatin powder Commonly known as: MYCOSTATIN/NYSTOP Apply 1 application topically 2 (two) times daily. Left thigh   sertraline 100 MG tablet Commonly known as: ZOLOFT Place 100 mg into feeding tube daily.   Trulicity 2.37 SE/8.3TD Sopn Generic drug: Dulaglutide Inject 0.5 mLs into the skin every Wednesday.      Allergies  Allergen Reactions  . Ace Inhibitors   . Contrast Media [Iodinated Diagnostic Agents]   . Dust Mite Extract   . Grapefruit Extract   . Lambs Quarters   . Penicillins   . Shellfish Allergy    Discharge Instructions    Diet - low sodium heart healthy   Complete by: As directed    Increase activity slowly   Complete by: As directed       The results of significant diagnostics from this hospitalization (including imaging, microbiology, ancillary and laboratory) are listed below for reference.    Significant Diagnostic Studies: CT HEAD WO CONTRAST  Result Date: 08/18/2019 CLINICAL DATA:  Altered mental status. EXAM: CT HEAD WITHOUT CONTRAST TECHNIQUE: Contiguous axial images were obtained from the base of the skull through the vertex without intravenous contrast. COMPARISON:  Brain MRI, dated September 04, 2017, is available. FINDINGS: Brain: There is mild cerebral atrophy with widening of the extra-axial spaces and ventricular dilatation. There are areas of decreased attenuation within the white matter tracts of the supratentorial brain, consistent with microvascular disease changes. Vascular: No hyperdense vessel or unexpected calcification. Skull: Normal. Negative for fracture or focal lesion.  Sinuses/Orbits: No acute finding. Other: None. IMPRESSION: 1. Generalized cerebral atrophy. 2. No acute intracranial abnormality. Electronically Signed   By: Virgina Norfolk M.D.   On: 08/18/2019 02:29   DG Chest Port 1 View  Result Date: 08/18/2019 CLINICAL DATA:  Fever and altered mental status. EXAM: PORTABLE CHEST 1 VIEW COMPARISON:  September 02, 2017 FINDINGS: Mild, stable areas of linear scarring and/or atelectasis are seen within the bilateral lung bases. There is no evidence of a pleural effusion or pneumothorax. The heart size and mediastinal contours are within normal limits. There is mild calcification of the aortic arch. Multilevel degenerative changes seen throughout the thoracic spine. IMPRESSION: Mild bibasilar linear scarring and/or atelectasis. Electronically Signed   By: Virgina Norfolk M.D.   On: 08/18/2019 01:37  Microbiology: Recent Results (from the past 240 hour(s))  Blood Culture (routine x 2)     Status: None (Preliminary result)   Collection Time: 08/18/19 12:34 AM   Specimen: BLOOD  Result Value Ref Range Status   Specimen Description BLOOD RIGHT ARM  Final   Special Requests   Final    BOTTLES DRAWN AEROBIC AND ANAEROBIC Blood Culture adequate volume   Culture   Final    NO GROWTH 4 DAYS Performed at [facility], [address], [place], [place] [address]    Report Status PENDING  Incomplete  Blood Culture (routine x 2)     Status: None (Preliminary result)   Collection Time: 08/18/19 12:36 AM   Specimen: BLOOD  Result Value Ref Range Status   Specimen Description BLOOD LEFT ARM  Final   Special Requests   Final    BOTTLES DRAWN AEROBIC AND ANAEROBIC Blood Culture results may not be optimal due to an inadequate volume of blood received in culture bottles   Culture   Final    NO GROWTH 4 DAYS Performed at [facility], [place] [address], [place], [place] [address]    Report Status PENDING  Incomplete  Urine culture     Status: Abnormal    Collection Time: 08/18/19  2:26 AM   Specimen: In/Out Cath Urine  Result Value Ref Range Status   Specimen Description IN/OUT CATH URINE  Final   Special Requests   Final    NONE Performed at [facility], [place] [address] [place], [place] [address]    Culture MULTIPLE SPECIES PRESENT, SUGGEST RECOLLECTION (A)  Final   Report Status 08/19/2019 FINAL  Final  SARS CORONAVIRUS 2 (TAT 6-24 HRS) Nasopharyngeal Nasopharyngeal Swab     Status: None   Collection Time: 08/18/19  3:17 AM   Specimen: Nasopharyngeal Swab  Result Value Ref Range Status   SARS Coronavirus 2 NEGATIVE NEGATIVE Final    Comment: (NOTE) SARS-CoV-2 target nucleic acids are NOT DETECTED. The SARS-CoV-2 RNA is generally detectable in upper and lower respiratory specimens during the acute phase of infection. Negative results do not preclude SARS-CoV-2 infection, do not rule out co-infections with other pathogens, and should not be used as the sole basis for treatment or other patient management decisions. Negative results must be combined with clinical observations, patient history, and epidemiological information. The expected result is Negative. Fact Sheet for Patients: SugarRoll.be Fact Sheet for Healthcare Providers: https://www.woods-mathews.com/ This test is not yet approved or cleared by the [place] FDA and  has been authorized for detection and/or diagnosis of SARS-CoV-2 by FDA under an Emergency Use Authorization (EUA). This EUA will remain  in effect (meaning this test can be used) for the duration of the COVID-19 declaration under Section 56 4(b)(1) of the Act, 21 U.S.C. section 360bbb-3(b)(1), unless the authorization is terminated or revoked sooner. Performed at [facility], [place] [address] [place], [place] [address]   SARS CORONAVIRUS 2 (TAT 6-24 HRS) Nasopharyngeal Nasopharyngeal Swab     Status: None   Collection Time: 08/20/19  7:56 PM    Specimen: Nasopharyngeal Swab  Result Value Ref Range Status   SARS Coronavirus 2 NEGATIVE NEGATIVE Final    Comment: (NOTE) SARS-CoV-2 target nucleic acids are NOT DETECTED. The SARS-CoV-2 RNA is generally detectable in upper and lower respiratory specimens during the acute phase of infection. Negative results do not preclude SARS-CoV-2 infection, do not rule out co-infections with other pathogens, and should  not be used as the sole basis for treatment or other patient management decisions. Negative results must be combined with clinical observations, patient history, and epidemiological information. The expected result is Negative. Fact Sheet for Patients: SugarRoll.be Fact Sheet for Healthcare Providers: https://www.woods-mathews.com/ This test is not yet approved or cleared by the [place] FDA and  has been authorized for detection and/or diagnosis of SARS-CoV-2 by FDA under an Emergency Use Authorization (EUA). This EUA will remain  in effect (meaning this test can be used) for the duration of the COVID-19 declaration under Section 56 4(b)(1) of the Act, 21 U.S.C. section 360bbb-3(b)(1), unless the authorization is terminated or revoked sooner. Performed at [facility], [place] [address], [place], [place] [address]      Labs: CBC: Recent Labs  Lab 08/18/19 0034 08/19/19 0345 08/20/19 0817 08/22/19 0130  WBC 12.4* 9.0 7.3 8.7  NEUTROABS 7.1  --   --   --   HGB 13.3 11.7* 11.4* 11.8*  HCT 44.0 39.0 37.0 39.2  MCV 98.0 98.7 94.4 97.0  PLT 229 183 155 470   Basic Metabolic Panel: Recent Labs  Lab 08/21/19 0718 08/21/19 1227 08/21/19 1533 08/21/19 1952 08/22/19 0130  NA 151* 150* 150* 148* 144  K 3.4* 3.6 3.5 3.6 3.7  CL 116* 117* 115* 115* 112*  CO2 23 23 27 22 23   GLUCOSE 180* 204* 204* 205* 150*  BUN 27* 27* 26* 24* 22  CREATININE 0.48 0.49 0.50 0.48 0.43*  CALCIUM 8.7* 8.6* 8.8* 8.7* 9.0   Liver Function  Tests: Recent Labs  Lab 08/18/19 0034  AST 48*  ALT 58*  ALKPHOS 109  BILITOT 0.5  PROT 7.4  ALBUMIN 3.4*   No results for input(s): LIPASE, AMYLASE in the last 168 hours. No results for input(s): AMMONIA in the last 168 hours. Cardiac Enzymes: No results for input(s): CKTOTAL, CKMB, CKMBINDEX, TROPONINI in the last 168 hours. BNP (last 3 results) No results for input(s): BNP in the last 8760 hours. CBG: Recent Labs  Lab 08/21/19 1643 08/21/19 2024 08/21/19 2346 08/22/19 0418 08/22/19 0803  GLUCAP 191* 179* 153* 120* 131*    Time spent: 35 minutes  Signed:  Berle Mull  [facility]  08/22/2019 11:09 AM

## 2019-08-23 LAB — CULTURE, BLOOD (ROUTINE X 2)
Culture: NO GROWTH
Culture: NO GROWTH
Special Requests: ADEQUATE

## 2019-12-22 ENCOUNTER — Other Ambulatory Visit: Payer: Self-pay

## 2019-12-22 ENCOUNTER — Emergency Department (HOSPITAL_COMMUNITY)

## 2019-12-22 ENCOUNTER — Encounter (HOSPITAL_COMMUNITY): Payer: Self-pay | Admitting: *Deleted

## 2019-12-22 ENCOUNTER — Emergency Department (HOSPITAL_COMMUNITY)
Admission: EM | Admit: 2019-12-22 | Discharge: 2019-12-23 | Disposition: A | Discharge status: Home / Self Care | Attending: Emergency Medicine | Admitting: Emergency Medicine

## 2019-12-22 DIAGNOSIS — R4182 Altered mental status, unspecified: Secondary | ICD-10-CM | POA: Diagnosis not present

## 2019-12-22 DIAGNOSIS — Y999 Unspecified external cause status: Secondary | ICD-10-CM | POA: Insufficient documentation

## 2019-12-22 DIAGNOSIS — F0390 Unspecified dementia without behavioral disturbance: Secondary | ICD-10-CM | POA: Diagnosis not present

## 2019-12-22 DIAGNOSIS — Y939 Activity, unspecified: Secondary | ICD-10-CM | POA: Insufficient documentation

## 2019-12-22 DIAGNOSIS — X58XXXA Exposure to other specified factors, initial encounter: Secondary | ICD-10-CM | POA: Insufficient documentation

## 2019-12-22 DIAGNOSIS — Y929 Unspecified place or not applicable: Secondary | ICD-10-CM | POA: Diagnosis not present

## 2019-12-22 DIAGNOSIS — S8001XA Contusion of right knee, initial encounter: Secondary | ICD-10-CM | POA: Insufficient documentation

## 2019-12-22 DIAGNOSIS — R569 Unspecified convulsions: Secondary | ICD-10-CM | POA: Insufficient documentation

## 2019-12-22 DIAGNOSIS — E119 Type 2 diabetes mellitus without complications: Secondary | ICD-10-CM | POA: Diagnosis not present

## 2019-12-22 DIAGNOSIS — Z20822 Contact with and (suspected) exposure to covid-19: Secondary | ICD-10-CM | POA: Insufficient documentation

## 2019-12-22 DIAGNOSIS — B372 Candidiasis of skin and nail: Secondary | ICD-10-CM

## 2019-12-22 DIAGNOSIS — Z79899 Other long term (current) drug therapy: Secondary | ICD-10-CM | POA: Insufficient documentation

## 2019-12-22 DIAGNOSIS — Z87891 Personal history of nicotine dependence: Secondary | ICD-10-CM | POA: Diagnosis not present

## 2019-12-22 LAB — COMPREHENSIVE METABOLIC PANEL
ALT: 13 U/L (ref 0–44)
ALT: 14 U/L (ref 0–44)
AST: 19 U/L (ref 15–41)
AST: 18 U/L (ref 15–41)
Albumin: 3.1 g/dL — ABNORMAL LOW (ref 3.5–5.0)
Albumin: 3.2 g/dL — ABNORMAL LOW (ref 3.5–5.0)
Alkaline Phosphatase: 106 U/L (ref 38–126)
Alkaline Phosphatase: 110 U/L (ref 38–126)
Anion gap: 10 (ref 5–15)
Anion gap: 8 (ref 5–15)
BUN: 20 mg/dL (ref 8–23)
BUN: 19 mg/dL (ref 8–23)
CO2: 24 mmol/L (ref 22–32)
CO2: 27 mmol/L (ref 22–32)
Calcium: 9.3 mg/dL (ref 8.9–10.3)
Calcium: 9.4 mg/dL (ref 8.9–10.3)
Chloride: 105 mmol/L (ref 98–111)
Chloride: 105 mmol/L (ref 98–111)
Creatinine, Ser: 0.59 mg/dL (ref 0.44–1.00)
Creatinine, Ser: 0.59 mg/dL (ref 0.44–1.00)
GFR calc Af Amer: >60 mL/min (ref >60)
GFR calc Af Amer: >60 mL/min (ref >60)
GFR calc non Af Amer: >60 mL/min (ref >60)
GFR calc non Af Amer: >60 mL/min (ref >60)
Glucose, Bld: 80 mg/dL (ref 70–99)
Glucose, Bld: 80 mg/dL (ref 70–99)
Potassium: 4.7 mmol/L (ref 3.5–5.1)
Potassium: 4.1 mmol/L (ref 3.5–5.1)
Sodium: 139 mmol/L (ref 135–145)
Sodium: 140 mmol/L (ref 135–145)
Total Bilirubin: 0.3 mg/dL (ref 0.3–1.2)
Total Bilirubin: 0.1 mg/dL — ABNORMAL LOW (ref 0.3–1.2)
Total Protein: 6.7 g/dL (ref 6.5–8.1)
Total Protein: 6.6 g/dL (ref 6.5–8.1)

## 2019-12-22 LAB — URINALYSIS, ROUTINE W REFLEX MICROSCOPIC
Bilirubin Urine: NEGATIVE
Glucose, UA: NEGATIVE mg/dL
Ketones, ur: NEGATIVE mg/dL
Leukocytes,Ua: NEGATIVE
Nitrite: NEGATIVE
Protein, ur: 30 mg/dL — AB
Specific Gravity, Urine: 1.021 (ref 1.005–1.030)
pH: 8 (ref 5.0–8.0)

## 2019-12-22 LAB — CBC WITH DIFFERENTIAL/PLATELET
Abs Immature Granulocytes: 0.04 10*3/uL (ref 0.00–0.07)
Basophils Absolute: 0 10*3/uL (ref 0.0–0.1)
Basophils Relative: 1 %
Eosinophils Absolute: 0.3 10*3/uL (ref 0.0–0.5)
Eosinophils Relative: 4 %
HCT: 37.1 % (ref 36.0–46.0)
Hemoglobin: 11.7 g/dL — ABNORMAL LOW (ref 12.0–15.0)
Immature Granulocytes: 1 %
Lymphocytes Relative: 41 %
Lymphs Abs: 2.8 10*3/uL (ref 0.7–4.0)
MCH: 28.1 pg (ref 26.0–34.0)
MCHC: 31.5 g/dL (ref 30.0–36.0)
MCV: 89.2 fL (ref 80.0–100.0)
Monocytes Absolute: 0.4 10*3/uL (ref 0.1–1.0)
Monocytes Relative: 6 %
Neutro Abs: 3.3 10*3/uL (ref 1.7–7.7)
Neutrophils Relative %: 47 %
Platelets: 244 10*3/uL (ref 150–400)
RBC: 4.16 MIL/uL (ref 3.87–5.11)
RDW: 13.9 % (ref 11.5–15.5)
WBC: 6.9 10*3/uL (ref 4.0–10.5)
nRBC: 0 % (ref 0.0–0.2)

## 2019-12-22 LAB — SARS CORONAVIRUS 2 BY RT PCR (HOSPITAL ORDER, PERFORMED IN ~~LOC~~ HOSPITAL LAB): SARS Coronavirus 2: NEGATIVE

## 2019-12-22 MED ORDER — NYSTATIN 100000 UNIT/GM EX POWD: 1.0000 "application " | Freq: Two times a day (BID) | CUTANEOUS | 0 refills | Status: AC

## 2019-12-22 MED ORDER — LORAZEPAM 2 MG/ML IJ SOLN
1.0000 mg | Freq: Once | INTRAMUSCULAR | Status: AC
  Administered 2019-12-22: 1 mg via INTRAVENOUS
  Filled 2019-12-22: qty 1

## 2019-12-22 NOTE — Discharge Instructions (Addendum)
Please have Christine Franklin brought back to the emergency department if she develops new or worsening symptoms.  Take nystatin as prescribed.

## 2019-12-22 NOTE — Progress Notes (Signed)
[facility] Liaison Note.  This is an active [facility] Patient. [facility] Liaison will follow while in ED and if admitted.    Please call with any hospice related questions.   If ambulance transport is needed, please use [facility] as they contract this service for our active hospice patients.  Mary Anne Robertson, RN [facility] Liaison [phone] 

## 2019-12-22 NOTE — ED Notes (Signed)
Pt to ct 

## 2019-12-22 NOTE — ED Provider Notes (Signed)
RN reports that family has concerns that previous clinician mentioned that patient has yeast, but did not discharge the patient with a prescription. The patient has personally been evaluated by me and has intertriginous candidiasis. Patient evaluated by me with beefy, erythematous rash with satellite lesions noted in the groin and buttocks. RN at bedside to chaperone. Will discharge with nystatin powder. Discussed with patient's daughter and all questions answered.   Barkley Boards, PA-C 12/22/19 2320    Benjiman Core, MD 12/23/19 [phone]

## 2019-12-22 NOTE — ED Provider Notes (Signed)
[facility] EMERGENCY DEPARTMENT Provider Note   CSN: [id number] Arrival date & time: 12/22/19  1504     History Chief Complaint  Patient presents with   Altered Mental Status    Christine Franklin is a 69 y.o. female.  Patient is a 69 y/o F coming from SNF who has multiple medical problems including end stage dementia, bedbound and feeding tube dependent, CVA with functional quadriplegia presenting to the ER for seizure like activity occurring today at the facility.  Patient's daughter is at bedside and provides most of the history as patient is nonverbal.  Patient's daughter reports that when she showed up to visit her mom the patient eyes were rolling up and around in her eyes and her whole body was trembling and she was diaphoretic. She also reports that her mother was groaning at the time. Unknown urinary incontinence. She reports that the patient had one seizure in 2019 and was placed on medication and has not had a seizure since then.  Patient is a DNR.  Once EMS arrived they reported that they did not see any kind of trembling or seizure-like activity.  The daughter reports that the patient now is mostly at her bedside but seems to be a little less responsive than normal.  Usually the patient will shake her head yes or no or grimace to questions but has not responded as much to her today as usual. Daughter reports that the patient is bedbound but occasionally gets placed in geri chair and noticed recently her riht leg gets caught in the side of the chair which has caused bruising and swelling in the knee. No known falls or other injuries.         Past Medical History:  Diagnosis Date   Anemia    Aphasia    CVA (cerebral vascular accident) ([place]) 2019   Dementia ([place])    Diabetes mellitus without complication ([place])    Dysphagia    Epilepsy ([place])    Functional quadriplegia ([facility])    Hyperlipidemia    Hypertensive heart disease    Major depressive  disorder    Obesity    PVD (peripheral vascular disease) ([place])     Patient Active Problem List   Diagnosis Date Noted   Pressure injury of skin 08/19/2019   UTI (urinary tract infection) 08/18/2019   History of CVA (cerebrovascular accident) 08/18/2019   Functional quadriplegia ([place]) 08/18/2019   Dehydration with hypernatremia 08/18/2019   Epilepsy ([place])    Diabetes mellitus without complication ([place])    Dysphagia    Hyperlipidemia    Hypertensive heart disease    Cerebral thrombosis with cerebral infarction 09/06/2017   Generalized tonic-clonic seizure ([place]) 09/03/2017   Postictal state ([place]) 09/03/2017   Myoclonic jerking 09/03/2017   Normocytic anemia 09/03/2017   Acute encephalopathy 09/02/2017    Past Surgical History:  Procedure Laterality Date   IR REPLC GASTRO/COLONIC TUBE PERCUT W/FLUORO  09/10/2017     OB History   No obstetric history on file.     No family history on file.  Social History   Tobacco Use   Smoking status: Former Smoker    Packs/day: 0.50    Years: 50.00    Pack years: 25.00    Quit date: 2019    Years since quitting: 2.6   Smokeless tobacco: Never Used  Substance Use Topics   Alcohol use: Not Currently   Drug use: Not Currently    Home Medications Prior to Admission medications  Medication Sig Start Date End Date Taking? Authorizing Provider  acetaminophen (TYLENOL) 325 MG tablet Place 650 mg into feeding tube every 12 (twelve) hours as needed (pain).    Yes [provider]  Amino Acids-Protein Hydrolys (FEEDING SUPPLEMENT, PRO-STAT SUGAR FREE 64,) LIQD Place 30 mLs into feeding tube in the morning and at bedtime.   Yes [provider]  ascorbic acid (VITAMIN C) 500 MG tablet Place 500 mg into feeding tube 2 (two) times daily.   Yes [provider]  aspirin 81 MG chewable tablet Place 81 mg into feeding tube daily.   Yes [provider]  atorvastatin (LIPITOR) 40 MG tablet  Take 1 tablet (40 mg total) by mouth daily at 6 PM. Patient taking differently: Place 40 mg into feeding tube daily at 6 PM.  09/14/17  Yes Gherghe, Vella Redhead, MD  Butalbital-APAP-Caffeine 50-300-40 MG CAPS Place 1 capsule into feeding tube 3 (three) times daily as needed (Migraine). Patient taking differently: Place 1 capsule into feeding tube 3 (three) times daily.  08/22/19  Yes Lavina Hamman, MD  carvedilol (COREG) 6.25 MG tablet Place 1 tablet (6.25 mg total) into feeding tube 2 (two) times daily with a meal. Patient taking differently: Place 6.25 mg into feeding tube in the morning and at bedtime. 8am, 9pm 09/14/17  Yes Gherghe, Vella Redhead, MD  Dulaglutide (TRULICITY) 3.79 KW/4.0XB SOPN Inject 0.75 mg into the skin every Wednesday.    Yes [provider]  ferrous sulfate 220 (44 Fe) MG/5ML solution Place 220 mg into feeding tube daily.   Yes [provider]  gabapentin (NEURONTIN) 100 MG capsule 100 mg at bedtime.    Yes [provider]  Glucagon, rDNA, (GLUCAGON EMERGENCY) 1 MG KIT Inject 1 mg into the muscle every 8 (eight) hours as needed (blood sugar less than 60).   Yes [provider]  HYDROcodone-acetaminophen (NORCO/VICODIN) 5-325 MG tablet Place 1 tablet into feeding tube 2 (two) times daily as needed for moderate pain. Patient taking differently: Place 1 tablet into feeding tube every 12 (twelve) hours as needed (pain).  08/22/19  Yes Lavina Hamman, MD  hydroxypropyl methylcellulose / hypromellose (ISOPTO TEARS / GONIOVISC) 2.5 % ophthalmic solution Place 1 drop into both eyes every 6 (six) hours as needed for dry eyes.    Yes [provider]  insulin detemir (LEVEMIR) 100 UNIT/ML injection Inject 47 Units into the skin at bedtime.    Yes [provider]  levETIRAcetam (KEPPRA) 100 MG/ML solution Place 10 mLs (1,000 mg total) into feeding tube 2 (two) times daily. 09/14/17  Yes Gherghe, Vella Redhead, MD  Loperamide HCl (IMODIUM A-D) 1 MG/7.5ML  LIQD Place 2 mg into feeding tube 2 (two) times daily.   Yes [provider]  montelukast (SINGULAIR) 10 MG tablet Place 10 mg into feeding tube at bedtime.    Yes [provider]  mupirocin ointment (BACTROBAN) 2 % Apply 1 application topically See admin instructions. Apply topically to peg tube sit every day and night shift for protocol   Yes [provider]  Nutritional Supplements (FEEDING SUPPLEMENT, GLUCERNA 1.5 CAL,) LIQD Place 1,000 mLs into feeding tube See admin instructions. 60 ml/hr - start running at 2pm and run continuously until 10am for 20 hours feeding potential per day.   Yes [provider]  nystatin (MYCOSTATIN/NYSTOP) powder Apply 1 application topically See admin instructions. Apply topically to left thigh every day and night shift for protocol   Yes [provider]  sertraline (ZOLOFT) 100 MG tablet Place 100 mg into feeding tube daily.   Yes [provider]  Water For Irrigation, Sterile (FREE WATER) SOLN Place 200 mLs into feeding tube every 6 (six) hours. 08/22/19  Yes Lavina Hamman, MD    Allergies    Ace inhibitors, Contrast media [iodinated diagnostic agents], Dust mite extract, Grapefruit extract, Lambs quarters, Penicillins, and Shellfish allergy  Review of Systems   Review of Systems  Unable to perform ROS: Dementia    Physical Exam Updated Vital Signs BP (!) 154/88    Pulse 62    Temp (!) 97.3 F (36.3 C) (Axillary)    Resp 13    Ht 5' 8"  (1.727 m)    Wt 85.5 kg    SpO2 100%    BMI 28.66 kg/m   Physical Exam Vitals and nursing note reviewed.  Constitutional:      Appearance: She is not toxic-appearing or diaphoretic.     Comments: Following some commands  HENT:     Head: Normocephalic.     Mouth/Throat:     Mouth: Mucous membranes are moist.  Eyes:     Conjunctiva/sclera: Conjunctivae normal.     Pupils: Pupils are equal, round, and reactive to light.  Cardiovascular:     Rate and Rhythm:  Normal rate and regular rhythm.  Pulmonary:     Effort: Pulmonary effort is normal.  Abdominal:     General: Abdomen is flat. There is no distension.     Palpations: Abdomen is soft. There is no mass.     Tenderness: There is no abdominal tenderness.     Comments: Feeding tube in place with normal surrounding skin  Musculoskeletal:     Right lower leg: No edema.     Left lower leg: Edema present.  Skin:    General: Skin is warm and dry.     Findings: No bruising (lateral right knee).     Comments: Significant yeast appearing rash in between groin and buttock. Stage 1-2 decubitus ulcer.  Neurological:     Mental Status: She is alert.     Comments: Moving all four extremities. Patient follows commands to open eyes but otherwise is moving arms around, trying to scratch in her groin and not following commands. Unable to fully assess due to severe dementia  Psychiatric:        Mood and Affect: Mood normal.     ED Results / Procedures / Treatments   Labs (all labs ordered are listed, but only abnormal results are displayed) Labs Reviewed  SARS CORONAVIRUS 2 BY RT PCR (HOSPITAL ORDER, [facility])  COMPREHENSIVE METABOLIC PANEL  CBC WITH DIFFERENTIAL/PLATELET  URINALYSIS, ROUTINE W REFLEX MICROSCOPIC    EKG None  Radiology DG Knee 2 Views Right  Result Date: 12/22/2019 CLINICAL DATA:  Recent fall with right knee pain, initial encounter EXAM: RIGHT KNEE - 2 VIEW COMPARISON:  None. FINDINGS: Degenerative changes of the knee joint are noted in all 3 compartments. No acute fracture or dislocation is seen. No soft tissue abnormality is noted. IMPRESSION: Degenerative change without acute abnormality. Electronically Signed   By: Inez Catalina M.D.   On: 12/22/2019 16:37   DG Chest Portable 1 View  Result Date: 12/22/2019 CLINICAL DATA:  Altered mental status EXAM: PORTABLE CHEST 1 VIEW COMPARISON:  08/18/2019 FINDINGS: Cardiac shadow is prominent but similar to  that seen on the prior exam. Aortic calcifications are noted. The lungs are clear. No bony abnormality  is noted. IMPRESSION: No acute abnormality seen. Electronically Signed   By: Inez Catalina M.D.   On: 12/22/2019 16:37    Procedures Procedures (including critical care time)  Medications Ordered in ED Medications  LORazepam (ATIVAN) injection 1 mg (has no administration in time range)    ED Course  I have reviewed the triage vital signs and the nursing notes.  Pertinent labs & imaging results that were available during my care of the patient were reviewed by me and considered in my medical decision making (see chart for details).  Clinical Course as of Dec 21 1853  Fri Dec 22, 2019  1608 Bed bound, PEG dependent dementia patient, DNR from nursing home presenting by EMS for episode of shaking and eye rolling which spontaneously resolved today. Unclear if seizure or not. Patient now at near baseline but maybe slightly less responsive per daughter at bedside. Will work up for AMS including urine, labs, chest xray, covid. Will scan head, xray her right knee    [KM]  1854 Attempted CT of patient head x2 but patient was combative. Will order ativan. Care signed out to [facility] PA due to change of shift.    [KM]    Clinical Course User Index [KM] Kristine Royal   MDM Rules/Calculators/A&P                           Final Clinical Impression(s) / ED Diagnoses Final diagnoses:  None    Rx / DC Orders ED Discharge Orders    None       Kristine Royal 12/22/19 Randol Kern    Lacretia Leigh, MD 12/28/19 [phone]

## 2019-12-22 NOTE — ED Notes (Signed)
To ct

## 2019-12-22 NOTE — ED Provider Notes (Signed)
Pt is a 69 year old female whose care was transferred to me at shift change from [facility], PA-C.  Her HPI is below:  Patient is a 69 y/o F coming from SNF who has multiple medical problems including end stage dementia, bedbound and feeding tube dependent, CVA with functional quadriplegia presenting to the ER for seizure like activity occurring today at the facility.  Patient's daughter is at bedside and provides most of the history as patient is nonverbal.  Patient's daughter reports that when she showed up to visit her mom the patient eyes were rolling up and around in her eyes and her whole body was trembling and she was diaphoretic. She also reports that her mother was groaning at the time. Unknown urinary incontinence. She reports that the patient had one seizure in 2019 and was placed on medication and has not had a seizure since then.  Patient is a DNR.  Once EMS arrived they reported that they did not see any kind of trembling or seizure-like activity.  The daughter reports that the patient now is mostly at her bedside but seems to be a little less responsive than normal.  Usually the patient will shake her head yes or no or grimace to questions but has not responded as much to her today as usual. Daughter reports that the patient is bedbound but occasionally gets placed in geri chair and noticed recently her riht leg gets caught in the side of the chair which has caused bruising and swelling in the knee. No known falls or other injuries.   Physical Exam  BP (!) 154/88   Pulse 62   Temp (!) 97.3 F (36.3 C) (Axillary)   Resp 13   Ht 5\' 8"  (1.727 m)   Wt 85.5 kg   SpO2 100%   BMI 28.66 kg/m   Physical Exam Vitals and nursing note reviewed.  Constitutional:      Appearance: She is not toxic-appearing or diaphoretic.     Comments: Following some commands  HENT:     Head: Normocephalic.     Mouth/Throat:     Mouth: Mucous membranes are moist.  Eyes:     Conjunctiva/sclera:  Conjunctivae normal.     Pupils: Pupils are equal, round, and reactive to light.  Cardiovascular:     Rate and Rhythm: Normal rate and regular rhythm.  Pulmonary:     Effort: Pulmonary effort is normal.  Abdominal:     General: Abdomen is flat. There is no distension.     Palpations: Abdomen is soft. There is no mass.     Tenderness: There is no abdominal tenderness.     Comments: Feeding tube in place with normal surrounding skin  Musculoskeletal:     Right lower leg: No edema.     Left lower leg: Edema present.  Skin:    General: Skin is warm and dry.     Findings: No bruising (lateral right knee).     Comments: Significant yeast appearing rash in between groin and buttock. Stage 1-2 decubitus ulcer.  Neurological:     Mental Status: She is alert.     Comments: Moving all four extremities. Patient follows commands to open eyes but otherwise is moving arms around, trying to scratch in her groin and not following commands. Unable to fully assess due to severe dementia  Psychiatric:        Mood and Affect: Mood normal.  ED Course/Procedures   Clinical Course as of Dec 21 2116  Fri Dec 22, 2019  1608 Bed bound, PEG dependent dementia patient, DNR from nursing home presenting by EMS for episode of shaking and eye rolling which spontaneously resolved today. Unclear if seizure or not. Patient now at near baseline but maybe slightly less responsive per daughter at bedside. Will work up for AMS including urine, labs, chest xray, covid. Will scan head, xray her right knee    [KM]  1854 Attempted CT of patient head x2 but patient was combative. Will order ativan. Care signed out to [facility] PA due to change of shift.    [KM]  2014 Ativan given and CT was able to be obtained with findings noted below:  1. No acute intracranial findings. 2. Periventricular white matter and corona radiata hypodensities favor chronic ischemic microvascular white matter disease. 3. Small bilateral mastoid  effusions. 4. Stable narrowing of the right external auditory canal, query otitis externa. 5. Right greater than left TMJ arthropathy. 6. Small amount of gas below the right eyelid adjacent to the globe, significance uncertain.    CT Head Wo Contrast [LJ]  2046 Similar to prior baseline values  Hemoglobin(!): 11.7 [LJ]    Clinical Course User Index [KM] Arlyn Dunning, PA-C [LJ] Placido Sou, PA-C    Procedures  MDM  Pt is a 69 y.o. female that presents with a history, physical exam, and ED Clinical Course as noted above. Her care was transferred to me at shift change.  Patient presents today with her daughter due to an episode of shaking/seizure-like activity.  Patient has a history of seizures and is currently taking Keppra for this.  Previous provider noted that patient is currently at baseline.  CBC and CMP are reassuring and appear to be baseline for this patient. UA is also reassuring. Discussed with patient's daughter and given results of imaging and labs will d/c pt back to her long term care facility tonight. Her daughter's questions were answered and she was amicable at the time of her d/c. Will having [facility] transport patient back to her facility at this time. Pt discussed with my attending physician Dr. Rolan Bucco who agrees with the above plan.  Note: Portions of this report may have been transcribed using voice recognition software. Every effort was made to ensure accuracy; however, inadvertent computerized transcription errors may be present.      Placido Sou, PA-C 12/23/19 6314    Rolan Bucco, MD 12/29/19 [phone]

## 2019-12-22 NOTE — ED Triage Notes (Signed)
The pt arrived by [facility] from [facility] on [address] per ems  Pt is a dnr  accodting to  [facility]  They were told the pt rolled her eyes back into her head an d they tholought that she had a seizurfe  Pt is  QUAD ACC ODRING TO EMS THE PT only answers yes and no questions that ems asked  Uses her lt arm more than right

## 2019-12-22 NOTE — ED Notes (Signed)
[facility] JUST CALLED  THEY NEED ANOTHER SPECIMEN  IINSUFFICIENT AMOUNT BLOOD  NEED NEW SPECIMENS

## 2019-12-22 NOTE — ED Notes (Signed)
I have called [facility] they will come and get her

## 2019-12-22 NOTE — ED Notes (Signed)
[facility] JUST CALLED THEY WERE UNABLE TO SCAN THIS PT TOO MUCH MOV EMEDNT  THE PT HAS BEEN BACK FROM [facility] APPROX AN HOUR   I WAS NOT TOLD UNTIL NOW THat  The sacn was not done

## 2019-12-22 NOTE — ED Notes (Signed)
daUGHTER AT  THE BEDSIDE  Pt had rectal tgemp done  t 17.9  Had a samaal diarrhea stool  Bad odor  Cleaned and diapered pure-wick placed  The daughter reports that   The haD 2 strokes back to back  And moves  All her extremities.

## 2019-12-22 NOTE — ED Notes (Signed)
Back to [facility] 

## 2019-12-23 NOTE — ED Notes (Signed)
Back to nursing home by [facility]

## 2019-12-23 NOTE — ED Notes (Signed)
Pt still waiting for [facility] to take her back to nursing home

## 2019-12-23 NOTE — ED Notes (Signed)
Report called to a nurse at [facility] daughter still at   The bedside but going back home

## 2020-04-22 ENCOUNTER — Emergency Department (HOSPITAL_COMMUNITY)

## 2020-04-22 ENCOUNTER — Emergency Department (HOSPITAL_COMMUNITY)
Admission: EM | Admit: 2020-04-22 | Discharge: 2020-04-23 | Disposition: A | Discharge status: Home / Self Care | Attending: Emergency Medicine | Admitting: Emergency Medicine

## 2020-04-22 DIAGNOSIS — Z87891 Personal history of nicotine dependence: Secondary | ICD-10-CM | POA: Insufficient documentation

## 2020-04-22 DIAGNOSIS — Z7982 Long term (current) use of aspirin: Secondary | ICD-10-CM | POA: Diagnosis not present

## 2020-04-22 DIAGNOSIS — K9420 Gastrostomy complication, unspecified: Secondary | ICD-10-CM

## 2020-04-22 DIAGNOSIS — K9423 Gastrostomy malfunction: Secondary | ICD-10-CM | POA: Insufficient documentation

## 2020-04-22 DIAGNOSIS — I119 Hypertensive heart disease without heart failure: Secondary | ICD-10-CM | POA: Diagnosis not present

## 2020-04-22 DIAGNOSIS — Z79899 Other long term (current) drug therapy: Secondary | ICD-10-CM | POA: Diagnosis not present

## 2020-04-22 DIAGNOSIS — Z8673 Personal history of transient ischemic attack (TIA), and cerebral infarction without residual deficits: Secondary | ICD-10-CM | POA: Insufficient documentation

## 2020-04-22 DIAGNOSIS — F0390 Unspecified dementia without behavioral disturbance: Secondary | ICD-10-CM | POA: Insufficient documentation

## 2020-04-22 DIAGNOSIS — E119 Type 2 diabetes mellitus without complications: Secondary | ICD-10-CM | POA: Diagnosis not present

## 2020-04-22 NOTE — ED Provider Notes (Signed)
[facility] EMERGENCY DEPARTMENT Provider Note   CSN: [id number] Arrival date & time: 04/22/20  1902     History Chief Complaint  Patient presents with  . G tube dislodged    Christine Franklin is a 69 y.o. female.  HPI   Patient presents to the ED for evaluation of a dislodged feeding tube.  Patient resides in a nursing home.  They went to provide the patient's tube feeds to her and she pulled out her feeding tube.  Patient has history of aphasia and deficits from a prior stroke.  Patient is unable to provide any history.  Past Medical History:  Diagnosis Date  . Anemia   . Aphasia   . CVA (cerebral vascular accident) ([place]) 2019  . Dementia ([place])   . Diabetes mellitus without complication ([place])   . Dysphagia   . Epilepsy ([place])   . Functional quadriplegia (H[facility])   . Hyperlipidemia   . Hypertensive heart disease   . Major depressive disorder   . Obesity   . PVD (peripheral vascular disease) [facility])     Patient Active Problem List   Diagnosis Date Noted  . Pressure injury of skin 08/19/2019  . UTI (urinary tract infection) 08/18/2019  . History of CVA (cerebrovascular accident) 08/18/2019  . Functional quadriplegia ([place]) 08/18/2019  . Dehydration with hypernatremia 08/18/2019  . Epilepsy ([place])   . Diabetes mellitus without complication ([place])   . Dysphagia   . Hyperlipidemia   . Hypertensive heart disease   . Cerebral thrombosis with cerebral infarction 09/06/2017  . 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 Postictal state ([place]) 09/03/2017  . Myoclonic jerking 09/03/2017  . Normocytic anemia 09/03/2017  . Acute encephalopathy 09/02/2017    Past Surgical History:  Procedure Laterality Date  . IR REPLC GASTRO/COLONIC TUBE PERCUT W/FLUORO  09/10/2017     OB History   No obstetric history on file.     No family history on file.  Social History   Tobacco Use  . Smoking status: Former Smoker    Packs/day: 0.50    Years: 50.00     Pack years: 25.00    Quit date: 2019    Years since quitting: 2.9  . Smokeless tobacco: Never Used  Substance Use Topics  . Alcohol use: Not Currently  . Drug use: Not Currently    Home Medications Prior to Admission medications   Medication Sig Start Date End Date Taking? Authorizing Provider  acetaminophen (TYLENOL) 325 MG tablet Place 650 mg into feeding tube every 12 (twelve) hours as needed (pain).     [provider]  Amino Acids-Protein Hydrolys (FEEDING SUPPLEMENT, PRO-STAT SUGAR FREE 64,) LIQD Place 30 mLs into feeding tube in the morning and at bedtime.    [provider]  ascorbic acid (VITAMIN C) 500 MG tablet Place 500 mg into feeding tube 2 (two) times daily.    [provider]  aspirin 81 MG chewable tablet Place 81 mg into feeding tube daily.    [provider]  atorvastatin (LIPITOR) 40 MG tablet Take 1 tablet (40 mg total) by mouth daily at 6 PM. Patient taking differently: Place 40 mg into feeding tube daily at 6 PM.  09/14/17   Gherghe, Vella Redhead, MD  Butalbital-APAP-Caffeine 50-300-40 MG CAPS Place 1 capsule into feeding tube 3 (three) times daily as needed (Migraine). Patient taking differently: Place 1 capsule into feeding tube 3 (three) times daily.  08/22/19   Lavina Hamman, MD  carvedilol ([place])  6.25 MG tablet Place 1 tablet (6.25 mg total) into feeding tube 2 (two) times daily with a meal. Patient taking differently: Place 6.25 mg into feeding tube in the morning and at bedtime. 8am, 9pm 09/14/17   Caren Griffins, MD  Dulaglutide (TRULICITY) 3.78 HY/8.5OY SOPN Inject 0.75 mg into the skin every Wednesday.     [provider]  ferrous sulfate 220 (44 Fe) MG/5ML solution Place 220 mg into feeding tube daily.    [provider]  gabapentin (NEURONTIN) 100 MG capsule 100 mg at bedtime.     [provider]  Glucagon, rDNA, (GLUCAGON EMERGENCY) 1 MG KIT Inject 1 mg into the muscle every 8 (eight) hours as  needed (blood sugar less than 60).    [provider]  HYDROcodone-acetaminophen (NORCO/VICODIN) 5-325 MG tablet Place 1 tablet into feeding tube 2 (two) times daily as needed for moderate pain. Patient taking differently: Place 1 tablet into feeding tube every 12 (twelve) hours as needed (pain).  08/22/19   Lavina Hamman, MD  hydroxypropyl methylcellulose / hypromellose (ISOPTO TEARS / GONIOVISC) 2.5 % ophthalmic solution Place 1 drop into both eyes every 6 (six) hours as needed for dry eyes.     [provider]  insulin detemir (LEVEMIR) 100 UNIT/ML injection Inject 47 Units into the skin at bedtime.     [provider]  levETIRAcetam (KEPPRA) 100 MG/ML solution Place 10 mLs (1,000 mg total) into feeding tube 2 (two) times daily. 09/14/17   Caren Griffins, MD  Loperamide HCl (IMODIUM A-D) 1 MG/7.5ML LIQD Place 2 mg into feeding tube 2 (two) times daily.    [provider]  montelukast (SINGULAIR) 10 MG tablet Place 10 mg into feeding tube at bedtime.     [provider]  mupirocin ointment (BACTROBAN) 2 % Apply 1 application topically See admin instructions. Apply topically to peg tube sit every day and night shift for protocol    [provider]  Nutritional Supplements (FEEDING SUPPLEMENT, GLUCERNA 1.5 CAL,) LIQD Place 1,000 mLs into feeding tube See admin instructions. 60 ml/hr - start running at 2pm and run continuously until 10am for 20 hours feeding potential per day.    [provider]  nystatin (MYCOSTATIN/NYSTOP) powder Apply 1 application topically 2 (two) times daily. 12/22/19   McDonald, Mia A, PA-C  sertraline (ZOLOFT) 100 MG tablet Place 100 mg into feeding tube daily.    [provider]  Water For Irrigation, Sterile (FREE WATER) SOLN Place 200 mLs into feeding tube every 6 (six) hours. 08/22/19   Lavina Hamman, MD    Allergies    Ace inhibitors, Contrast media [iodinated diagnostic agents], Dust mite extract,  Grapefruit extract, Lambs quarters, Penicillins, and Shellfish allergy  Review of Systems   Review of Systems  All other systems reviewed and are negative.   Physical Exam Updated Vital Signs BP (!) 169/99   Pulse 62   Resp 16   Ht 1.727 m (5' 8" )   Wt 85.5 kg   SpO2 100%   BMI 28.66 kg/m   Physical Exam Constitutional:      Appearance: She is not toxic-appearing or diaphoretic.  HENT:     Head: Normocephalic and atraumatic.     Nose: No congestion or rhinorrhea.     Mouth/Throat:     Pharynx: No oropharyngeal exudate or posterior oropharyngeal erythema.  Eyes:     General:        Right eye: No discharge.  Left eye: No discharge.     Conjunctiva/sclera: Conjunctivae normal.  Cardiovascular:     Rate and Rhythm: Normal rate and regular rhythm.     Pulses: Normal pulses.  Pulmonary:     Effort: Pulmonary effort is normal. No respiratory distress.     Breath sounds: Normal breath sounds. No stridor.  Abdominal:     General: Bowel sounds are normal. There is no distension.     Tenderness: There is no abdominal tenderness.     Comments: G-tube site without erythema or drainage  Musculoskeletal:        General: No swelling.     Right lower leg: No edema.     Left lower leg: No edema.  Skin:    General: Skin is warm and dry.  Neurological:     Mental Status: She is alert. Mental status is at baseline.     ED Results / Procedures / Treatments   Labs (all labs ordered are listed, but only abnormal results are displayed) Labs Reviewed - No data to display  EKG None  Radiology DG Abdomen 1 View  Result Date: 04/22/2020 CLINICAL DATA:  G-tube replacement EXAM: ABDOMEN - 1 VIEW COMPARISON:  09/10/2017 FINDINGS: Contrast was injected through the G-tube which fills the fundus of the stomach. No contrast extravasation. No organomegaly or free air. Prior cholecystectomy. IMPRESSION: Tube and contrast within the stomach. No evidence of contrast extravasation.  Electronically Signed   By: Rolm Baptise M.D.   On: 04/22/2020 22:00    Procedures Gastrostomy tube replacement  Date/Time: 04/22/2020 9:32 PM Performed by: Dorie Rank, MD Authorized by: Dorie Rank, MD  Consent: Verbal consent obtained. Risks and benefits: risks, benefits and alternatives were discussed Consent given by: patient and guardian Patient identity confirmed: verbally with patient Time out: Immediately prior to procedure a "time out" was called to verify the correct patient, procedure, equipment, support staff and site/side marked as required. Local anesthesia used: no  Anesthesia: Local anesthesia used: no  Sedation: Patient sedated: no  Patient tolerance: patient tolerated the procedure well with no immediate complications Comments: 61 French Foley catheter placed at gastrostomy tube site, inserted without difficulty or significant resistance, gastric contents noted to drain through the tube    (including critical care time)  Medications Ordered in ED Medications - No data to display  ED Course  I have reviewed the triage vital signs and the nursing notes.  Pertinent labs & imaging results that were available during my care of the patient were reviewed by me and considered in my medical decision making (see chart for details).    MDM Rules/Calculators/A&P                         Pt with g tube dislodged.  No record or of size or type of prior feeding tube.  Replaced with a foley in the ED without difficulty. Xray confirms placement.  Stable for discharge.  Can follow up as outpatient to replace with appropriate peg tube.  Foley can be used in the meantime for feeding, liquid meds  Final Clinical Impression(s) / ED Diagnoses Final diagnoses:  Complication of gastrostomy tube [facility])    Rx / DC Orders ED Discharge Orders    None       Dorie Rank, MD 04/23/20 1545

## 2020-04-22 NOTE — ED Notes (Signed)
Family updated as to patient's status at bedside at this time.

## 2020-04-22 NOTE — ED Notes (Signed)
Sharl Ma, LPN contacted at [facility] made aware pt is discharged.

## 2020-04-22 NOTE — Discharge Instructions (Signed)
Follow up with your doctor to arrange for PEG tube replacement.  A foley catheter was placed for you to use temporarily

## 2020-04-22 NOTE — ED Triage Notes (Signed)
BIB EMS from [facility]. Got her evening meds and LPN went to get feeds and patient pulled out her feeding tube. Patient is aphasia and left side deficit from stroke in 2019. Not following commands per baseline.

## 2020-04-22 NOTE — ED Notes (Signed)
A temperature was received from EMS of 97. Could not obtain a temperature as the pt has existing deficits from a stroke and cannot open mouth. An axillary temperature was attempted and not obtained.

## 2020-04-22 NOTE — ED Notes (Signed)
Secretary contacted to arrange PTAR to facility.

## 2020-04-23 NOTE — ED Notes (Signed)
Called PTAR--they stated they were never called about this patient

## 2020-04-23 NOTE — ED Notes (Signed)
Pt had a bowel movement and urinated, pt cleaned up and changed.

## 2020-04-24 ENCOUNTER — Other Ambulatory Visit (HOSPITAL_COMMUNITY): Payer: Self-pay | Admitting: Family Medicine

## 2020-04-24 DIAGNOSIS — R633 Feeding difficulties, unspecified: Secondary | ICD-10-CM

## 2020-04-25 ENCOUNTER — Other Ambulatory Visit (HOSPITAL_COMMUNITY): Payer: Self-pay | Admitting: Family Medicine

## 2020-04-25 ENCOUNTER — Ambulatory Visit (HOSPITAL_COMMUNITY)
Admission: RE | Admit: 2020-04-25 | Discharge: 2020-04-25 | Disposition: A | Discharge status: Home / Self Care | Source: Ambulatory Visit | Attending: Family Medicine | Admitting: Family Medicine

## 2020-04-25 DIAGNOSIS — R633 Feeding difficulties, unspecified: Secondary | ICD-10-CM

## 2020-04-25 DIAGNOSIS — Z431 Encounter for attention to gastrostomy: Secondary | ICD-10-CM | POA: Diagnosis not present

## 2020-04-25 DIAGNOSIS — R1310 Dysphagia, unspecified: Secondary | ICD-10-CM | POA: Insufficient documentation

## 2020-04-25 HISTORY — PX: IR REPLACE G-TUBE SIMPLE WO FLUORO: IMG2323

## 2020-04-25 MED ORDER — LIDOCAINE VISCOUS HCL 2 % MT SOLN
OROMUCOSAL | Status: AC
  Administered 2020-04-25: 13:00:00 5 mL
  Filled 2020-04-25: qty 15

## 2020-04-25 NOTE — Procedures (Signed)
PROCEDURE SUMMARY:  Successful replacement of 18 Fr balloon-retention gastrostomy tube. No complications. EBL = 0 mL. Ready for use.  Please see imaging section of Epic for full dictation.   Jassmin Kemmerer PA-C 04/25/2020 2:00 PM

## 2020-06-12 ENCOUNTER — Other Ambulatory Visit: Payer: Self-pay

## 2020-06-12 ENCOUNTER — Non-Acute Institutional Stay: Payer: Medicare Other | Admitting: Hospice

## 2020-06-12 DIAGNOSIS — F0390 Unspecified dementia without behavioral disturbance: Secondary | ICD-10-CM

## 2020-06-12 DIAGNOSIS — Z515 Encounter for palliative care: Secondary | ICD-10-CM

## 2020-06-12 NOTE — Progress Notes (Signed)
[facility] Consult Note Telephone: [phone]  Fax: [phone]  PATIENT NAME: Christine Franklin [address] [place] [address] [phone] (home)  DOB: December 01, 1950 MRN: [id number]  PRIMARY CARE PROVIDER:    Benito Mccreedy, MD,  [address] [place] [address] [phone]  REFERRING PROVIDER:   Martinique Blattenberger, NP  RESPONSIBLE PARTY:   Extended Emergency Contact Information Primary Emergency Contact: [place] Phone: [phone] Mobile Phone: [phone] Relation: Daughter Secondary Emergency Contact: [place] Phone: [phone] Mobile Phone: [phone] Relation: Niece  CHIEF COMPLAIN: Initial palliative care visit/Dementia  Visit is to build trust and highlight Palliative Medicine as specialized medical care for people living with serious illness, aimed at facilitating better quality of life through symptoms relief, assisting with advance care plan and establishing goals of care.  NP spoke with Dynita updating her on visit; she endorsed palliative service.  She said family will be interested in hospice service for patient when she is eligible for it.  Palliative care team will continue to support patient, patient's family, and medical team.  RECOMMENDATIONS/PLAN:   Advance Care Planning: Our advance care planning conversation included a discussion about  the value and importance of advance care planning, exploration of goals of care in the event of a sudden injury or illness, identification and preparation of a healthcare agent and review and updating or creation of an advance directive document.  CODE STATUS: Code status reviewed. Patient is a DO NOT RESUSCITATE.   GOALS OF CARE: Goals of care include to maximize quality of life and symptom management.   I spent  46 minutes providing this consultation. More than 50% of the time in this consultation was spent in  counseling and care coordination. ____________________________________________________________________________  Symptom Management:  Dementia: Ongoing memory loss/confusion related to dementia, FAST 7B, ambulatory ability is lost, bedbound, not able to engage in meaningful conversation because of limited intelligible words.  She is incontinent of bowel and bladder,  total assist in activities of daily living, hoyer lift for transfers.  Continue Continue supportive care.  Continue Glucerna for nutritional support. Palliative will continue to monitor for symptom management/decline and make recommendations as needed. Return 6 weeks or prn. Encouraged to call provider sooner with any concerns.   Past Medical History:  Diagnosis Date  . Anemia   . Aphasia   . CVA (cerebral vascular accident) ([place]) 2019  . Dementia ([place])   . Diabetes mellitus without complication ([place])   . Dysphagia   . Epilepsy ([place])   . Functional quadriplegia (H[facility])   . Hyperlipidemia   . Hypertensive heart disease   . Major depressive disorder   . Obesity   . PVD (peripheral vascular disease) ([facility])       PPS: 20%  HOSPICE ELIGIBILITY/DIAGNOSIS: TBD  HISTORY OF PRESENT ILLNESS:  Christine Franklin is a 70 y.o. female with multiple medical problems including ongoing memory loss related to dementia; patient is FAST 7B, not able to engage in meaningful conversation bedbound incontinent of bowel and bladder.  Patient with history of CVA, functional quadriplegia, epilepsy. History obtained from review of EMR, discussion with primary team/primary RN and interview with patient/family.  Patient is a poor historian due to dementia.  Rest of 10 point system reviewed and negative.   Labs:   No results for input(s): WBC, HGB, HCT, PLT, MCV in the last 168 hours. No results for input(s): NA, K, CL, CO2, BUN, CREATININE, GLUCOSE in the last 168 hours. Latest  GFR by Cockcroft Gault (not valid in AKI or ESRD) CrCl cannot be calculated  (Patient's most recent lab result is older than the maximum 21 days allowed.). No results for input(s): AST, ALT, ALKPHOS, GGT in the last 168 hours.  Invalid input(s): TBILI, CONJBILI, ALB, TOTALPROTEIN No components found for: ALB No results for input(s): APTT, INR in the last 168 hours.  Invalid input(s): PTPATIENT No results for input(s): BNP, PROBNP in the last 168 hours.   PAST MEDICAL HISTORY:  Past Medical History:  Diagnosis Date  . Anemia   . Aphasia   . CVA (cerebral vascular accident) ([place]) 2019  . Dementia ([place])   . Diabetes mellitus without complication ([place])   . Dysphagia   . Epilepsy ([place])   . Functional quadriplegia (HCC)   . Hyperlipidemia   . Hypertensive heart disease   . Major depressive disorder   . Obesity   . PVD (peripheral vascular disease) ([place])      SOCIAL HX: Patient lives in SNF for ongoing care Social History   Tobacco Use  . Smoking status: Former Smoker    Packs/day: 0.50    Years: 50.00    Pack years: 25.00    Quit date: 2019    Years since quitting: 3.0  . Smokeless tobacco: Never Used  Substance Use Topics  . Alcohol use: Not Currently    FAMILY HX: No family history on file.  ALLERGIES:  Allergies  Allergen Reactions  . Ace Inhibitors Other (See Comments)    Unknown reaction  . Contrast Media [Iodinated Diagnostic Agents] Other (See Comments)    Unknown reaction  . Dust Mite Extract Other (See Comments)    Unknown reaction  . Grapefruit Extract Other (See Comments)    Unknown reaction  . Lambs Quarters Other (See Comments)    Allergy to lamb per MAR - unknown reaction  . Penicillins Other (See Comments)    Unknown reaction  . Shellfish Allergy Other (See Comments)    Unknown reaction      PERTINENT MEDICATIONS:  Outpatient Encounter Medications as of 06/12/2020  Medication Sig  . acetaminophen (TYLENOL) 325 MG tablet Place 650 mg into feeding tube every 12 (twelve) hours as needed (pain).   . Amino Acids-Protein  Hydrolys (FEEDING SUPPLEMENT, PRO-STAT SUGAR FREE 64,) LIQD Place 30 mLs into feeding tube in the morning and at bedtime.  Marland Kitchen ascorbic acid (VITAMIN C) 500 MG tablet Place 500 mg into feeding tube 2 (two) times daily.  Marland Kitchen aspirin 81 MG chewable tablet Place 81 mg into feeding tube daily.  Marland Kitchen atorvastatin (LIPITOR) 40 MG tablet Take 1 tablet (40 mg total) by mouth daily at 6 PM. (Patient taking differently: Place 40 mg into feeding tube daily at 6 PM. )  . Butalbital-APAP-Caffeine 50-300-40 MG CAPS Place 1 capsule into feeding tube 3 (three) times daily as needed ([place]igraine). (Patient taking differently: Place 1 capsule into feeding tube 3 (three) times daily. )  . carvedilol (COREG) 6.25 MG tablet Place 1 tablet (6.25 mg total) into feeding tube 2 (two) times daily with a meal. (Patient taking differently: Place 6.25 mg into feeding tube in the morning and at bedtime. 8am, 9pm)  . Dulaglutide (TRULICITY) 4.43 XV/4.0GQ SOPN Inject 0.75 mg into the skin every Wednesday.   . ferrous sulfate 220 (44 Fe) MG/5ML solution Place 220 mg into feeding tube daily.  Marland Kitchen gabapentin (NEURONTIN) 100 MG capsule 100 mg at bedtime.   . Glucagon, rDNA, (GLUCAGON EMERGENCY) 1 MG KIT Inject 1  mg into the muscle every 8 (eight) hours as needed (blood sugar less than 60).  Marland Kitchen HYDROcodone-acetaminophen (NORCO/VICODIN) 5-325 MG tablet Place 1 tablet into feeding tube 2 (two) times daily as needed for moderate pain. (Patient taking differently: Place 1 tablet into feeding tube every 12 (twelve) hours as needed (pain). )  . hydroxypropyl methylcellulose / hypromellose (ISOPTO TEARS / GONIOVISC) 2.5 % ophthalmic solution Place 1 drop into both eyes every 6 (six) hours as needed for dry eyes.   . insulin detemir (LEVEMIR) 100 UNIT/ML injection Inject 47 Units into the skin at bedtime.   . levETIRAcetam (KEPPRA) 100 MG/ML solution Place 10 mLs (1,000 mg total) into feeding tube 2 (two) times daily.  . Loperamide HCl (IMODIUM A-D) 1  MG/7.5ML LIQD Place 2 mg into feeding tube 2 (two) times daily.  . montelukast (SINGULAIR) 10 MG tablet Place 10 mg into feeding tube at bedtime.   . mupirocin ointment (BACTROBAN) 2 % Apply 1 application topically See admin instructions. Apply topically to peg tube sit every day and night shift for protocol  . Nutritional Supplements (FEEDING SUPPLEMENT, GLUCERNA 1.5 CAL,) LIQD Place 1,000 mLs into feeding tube See admin instructions. 60 ml/hr - start running at 2pm and run continuously until 10am for 20 hours feeding potential per day.  . nystatin (MYCOSTATIN/NYSTOP) powder Apply 1 application topically 2 (two) times daily.  . sertraline (ZOLOFT) 100 MG tablet Place 100 mg into feeding tube daily.  . Water For Irrigation, Sterile (FREE WATER) SOLN Place 200 mLs into feeding tube every 6 (six) hours.   No facility-administered encounter medications on file as of 06/12/2020.    ROS  General: NAD Constitution: No report of fever/chills EYES: No report of vision changes ENMT: No report of xerostomia Cardiovascular: No report of chest pain Pulmonary: No report of  cough dyspnea  Abdomen: No report of constipation or diarrhea GU: Urinary incontinence MSK: ROM limitations, no falls reported Skin: No report of rash/bruising Neurological: weakness Psych: Positive mood Heme/lymph/immuno: No report of bruising or abnormal bleeding   PHYSICAL EXAM  Constitutional: In no acute distress Cardiovascular: regular rate and rhythm Pulmonary: no cough, no increased work of breathing, normal respiratory effort Abdomen: soft, non tender, positive bowel sounds in all quadrants; PEG tube in place patent GU:  no suprapubic tenderness Eyes: Normal lids, no discharge, sclera anicteric ENMT: Moist mucous membranes, nothing by mouth Musculoskeletal:  no edema in BLE Skin: no rash to visible skin, dry skin,warm without cyanosis Psych: non-anxious affect Neurological: Weakness but otherwise non  focal Heme/lymph/immuno: no bruises, no bleeding  Palliative Care was asked to follow this patient by consultation request of Martinique Blattenberger, NP to help address advance care planning and complex decision making. Thank you for the opportunity to participate in the care of Jakyra Darnold Please call our office at [phone] if we can be of additional assistance.  Note: Portions of this note were generated with [employer]. Dictation errors may occur despite best attempts at proofreading.  Teodoro Spray, NP

## 2020-08-02 ENCOUNTER — Ambulatory Visit (HOSPITAL_COMMUNITY)
Admission: RE | Admit: 2020-08-02 | Discharge: 2020-08-02 | Disposition: A | Discharge status: Home / Self Care | Source: Ambulatory Visit | Attending: Family Medicine | Admitting: Family Medicine

## 2020-08-02 ENCOUNTER — Other Ambulatory Visit: Payer: Self-pay

## 2020-08-02 ENCOUNTER — Other Ambulatory Visit (HOSPITAL_COMMUNITY): Payer: Self-pay | Admitting: Family Medicine

## 2020-08-02 DIAGNOSIS — R633 Feeding difficulties, unspecified: Secondary | ICD-10-CM

## 2020-08-02 DIAGNOSIS — Z431 Encounter for attention to gastrostomy: Secondary | ICD-10-CM

## 2020-08-02 HISTORY — PX: IR REPLACE G-TUBE SIMPLE WO FLUORO: IMG2323

## 2020-08-02 MED ORDER — LIDOCAINE VISCOUS HCL 2 % MT SOLN
OROMUCOSAL | Status: AC
  Administered 2020-08-02: 5 mL
  Filled 2020-08-02: qty 15

## 2020-08-02 MED ORDER — IOHEXOL 300 MG/ML  SOLN: 50.0000 mL | Freq: Once | INTRAMUSCULAR | Status: DC | PRN

## 2020-10-29 ENCOUNTER — Emergency Department (HOSPITAL_COMMUNITY)
Admission: EM | Admit: 2020-10-29 | Discharge: 2020-10-29 | Disposition: A | Discharge status: Home / Self Care | Attending: Emergency Medicine | Admitting: Emergency Medicine

## 2020-10-29 DIAGNOSIS — Z87891 Personal history of nicotine dependence: Secondary | ICD-10-CM | POA: Diagnosis not present

## 2020-10-29 DIAGNOSIS — Z794 Long term (current) use of insulin: Secondary | ICD-10-CM | POA: Insufficient documentation

## 2020-10-29 DIAGNOSIS — K9423 Gastrostomy malfunction: Secondary | ICD-10-CM | POA: Insufficient documentation

## 2020-10-29 DIAGNOSIS — E119 Type 2 diabetes mellitus without complications: Secondary | ICD-10-CM | POA: Diagnosis not present

## 2020-10-29 DIAGNOSIS — F0390 Unspecified dementia without behavioral disturbance: Secondary | ICD-10-CM | POA: Insufficient documentation

## 2020-10-29 DIAGNOSIS — Z7982 Long term (current) use of aspirin: Secondary | ICD-10-CM | POA: Insufficient documentation

## 2020-10-29 DIAGNOSIS — T85598A Other mechanical complication of other gastrointestinal prosthetic devices, implants and grafts, initial encounter: Secondary | ICD-10-CM

## 2020-10-29 NOTE — Discharge Instructions (Addendum)
Your feeding tube has been replaced.  If you develop new, or concerning changes return here for evaluation.

## 2020-10-29 NOTE — ED Notes (Addendum)
Report called to [facility]. PTAR called.

## 2020-10-29 NOTE — ED Provider Notes (Signed)
[facility] DEPT Provider Note   CSN: [id number] Arrival date & time: 10/29/20  0233     History No chief complaint on file.   Christine Franklin is a 70 y.o. female.  HPI Essentially nonverbal patient with dementia, level 5 caveat, presents from nursing facility with concern for dislodged feeding tube.  Patient cannot provide any details of her history.  Details are obtained by EMS providers and chart review.  Prior medical problems as below from hospitalization within the past year: Christine Franklin is a 70 y.o. female with multiple medical problems including ongoing memory loss related to dementia; patient is FAST 7B, not able to engage in meaningful conversation bedbound incontinent of bowel and bladder.  Patient with history of CVA, functional quadriplegia, epilepsy    Today she presents via EMS after the feeding tube apparently came out overnight.  Nursing home staff placed a Foley catheter into the gastrostomy site.  No report of other changes in vital signs, behavior, no fall.  EMS reports essentially no interaction in route, no hemodynamic instability.     Past Medical History:  Diagnosis Date   Anemia    Aphasia    CVA (cerebral vascular accident) ([place]) 2019   Dementia ([place])    Diabetes mellitus without complication ([place])    Dysphagia    Epilepsy ([place])    Functional quadriplegia (HCC)    Hyperlipidemia    Hypertensive heart disease    Major depressive disorder    Obesity    PVD (peripheral vascular disease) ([place])     Patient Active Problem List   Diagnosis Date Noted   Pressure injury of skin 08/19/2019   UTI (urinary tract infection) 08/18/2019   History of CVA (cerebrovascular accident) 08/18/2019   Functional quadriplegia ([place]) 08/18/2019   Dehydration with hypernatremia 08/18/2019   Epilepsy ([place])    Diabetes mellitus without complication ([place])    Dysphagia    Hyperlipidemia    Hypertensive heart disease    Cerebral thrombosis  with cerebral infarction 09/06/2017   Generalized tonic-clonic seizure ([place]) 09/03/2017   Postictal state ([place]) 09/03/2017   Myoclonic jerking 09/03/2017   Normocytic anemia 09/03/2017   Acute encephalopathy 09/02/2017    Past Surgical History:  Procedure Laterality Date   IR REPLACE G-TUBE SIMPLE WO FLUORO  04/25/2020   IR REPLACE G-TUBE SIMPLE WO FLUORO  08/02/2020   IR REPLC GASTRO/COLONIC TUBE PERCUT W/FLUORO  09/10/2017     OB History   No obstetric history on file.     No family history on file.  Social History   Tobacco Use   Smoking status: Former    Packs/day: 0.50    Years: 50.00    Pack years: 25.00    Types: Cigarettes    Quit date: 2019    Years since quitting: 3.4   Smokeless tobacco: Never  Substance Use Topics   Alcohol use: Not Currently   Drug use: Not Currently    Home Medications Prior to Admission medications   Medication Sig Start Date End Date Taking? Authorizing Provider  acetaminophen (TYLENOL) 325 MG tablet Place 650 mg into feeding tube every 12 (twelve) hours as needed (pain).     [provider]  Amino Acids-Protein Hydrolys (FEEDING SUPPLEMENT, PRO-STAT SUGAR FREE 64,) LIQD Place 30 mLs into feeding tube in the morning and at bedtime.    [provider]  ascorbic acid (VITAMIN C) 500 MG tablet Place 500 mg into feeding tube 2 (two) times daily.  [provider]  aspirin 81 MG chewable tablet Place 81 mg into feeding tube daily.    [provider]  atorvastatin (LIPITOR) 40 MG tablet Take 1 tablet (40 mg total) by mouth daily at 6 PM. Patient taking differently: Place 40 mg into feeding tube daily at 6 PM.  09/14/17   Gherghe, Vella Redhead, MD  Butalbital-APAP-Caffeine 50-300-40 MG CAPS Place 1 capsule into feeding tube 3 (three) times daily as needed (Migraine). Patient taking differently: Place 1 capsule into feeding tube 3 (three) times daily.  08/22/19   Lavina Hamman, MD  carvedilol (COREG) 6.25 MG tablet  Place 1 tablet (6.25 mg total) into feeding tube 2 (two) times daily with a meal. Patient taking differently: Place 6.25 mg into feeding tube in the morning and at bedtime. 8am, 9pm 09/14/17   Caren Griffins, MD  Dulaglutide (TRULICITY) 6.73 AL/9.3XT SOPN Inject 0.75 mg into the skin every Wednesday.     [provider]  ferrous sulfate 220 (44 Fe) MG/5ML solution Place 220 mg into feeding tube daily.    [provider]  gabapentin (NEURONTIN) 100 MG capsule 100 mg at bedtime.     [provider]  Glucagon, rDNA, (GLUCAGON EMERGENCY) 1 MG KIT Inject 1 mg into the muscle every 8 (eight) hours as needed (blood sugar less than 60).    [provider]  HYDROcodone-acetaminophen (NORCO/VICODIN) 5-325 MG tablet Place 1 tablet into feeding tube 2 (two) times daily as needed for moderate pain. Patient taking differently: Place 1 tablet into feeding tube every 12 (twelve) hours as needed (pain).  08/22/19   Lavina Hamman, MD  hydroxypropyl methylcellulose / hypromellose (ISOPTO TEARS / GONIOVISC) 2.5 % ophthalmic solution Place 1 drop into both eyes every 6 (six) hours as needed for dry eyes.     [provider]  insulin detemir (LEVEMIR) 100 UNIT/ML injection Inject 47 Units into the skin at bedtime.     [provider]  levETIRAcetam (KEPPRA) 100 MG/ML solution Place 10 mLs (1,000 mg total) into feeding tube 2 (two) times daily. 09/14/17   Caren Griffins, MD  Loperamide HCl (IMODIUM A-D) 1 MG/7.5ML LIQD Place 2 mg into feeding tube 2 (two) times daily.    [provider]  montelukast (SINGULAIR) 10 MG tablet Place 10 mg into feeding tube at bedtime.     [provider]  mupirocin ointment (BACTROBAN) 2 % Apply 1 application topically See admin instructions. Apply topically to peg tube sit every day and night shift for protocol    [provider]  Nutritional Supplements (FEEDING SUPPLEMENT, GLUCERNA 1.5 CAL,) LIQD Place 1,000  mLs into feeding tube See admin instructions. 60 ml/hr - start running at 2pm and run continuously until 10am for 20 hours feeding potential per day.    [provider]  nystatin (MYCOSTATIN/NYSTOP) powder Apply 1 application topically 2 (two) times daily. 12/22/19   McDonald, Mia A, PA-C  sertraline (ZOLOFT) 100 MG tablet Place 100 mg into feeding tube daily.    [provider]  Water For Irrigation, Sterile (FREE WATER) SOLN Place 200 mLs into feeding tube every 6 (six) hours. 08/22/19   Lavina Hamman, MD    Allergies    Ace inhibitors, Contrast media [iodinated diagnostic agents], Dust mite extract, Grapefruit extract, Lambs quarters, Penicillins, and Shellfish allergy  Review of Systems   Review of Systems  Unable to perform ROS: Patient nonverbal   Physical Exam Updated Vital Signs BP 101/75 (BP  Location: Left Arm)   Pulse (!) 57   Temp 98.5 F (36.9 C) (Axillary)   Resp 18   SpO2 98%   Physical Exam Vitals and nursing note reviewed.  Constitutional:      General: She is not in acute distress.    Appearance: She is well-developed. She is ill-appearing. She is not toxic-appearing.  HENT:     Head: Normocephalic and atraumatic.  Eyes:     Conjunctiva/sclera: Conjunctivae normal.  Cardiovascular:     Rate and Rhythm: Normal rate and regular rhythm.  Pulmonary:     Effort: Pulmonary effort is normal. No respiratory distress.     Breath sounds: Normal breath sounds. No stridor.  Abdominal:     General: There is no distension.     Tenderness: There is no abdominal tenderness. There is no guarding.     Comments: Gastrostomy site unremarkable with Foley catheter left upper quadrant  Skin:    General: Skin is warm and dry.  Neurological:     Cranial Nerves: No cranial nerve deficit.     Comments: Quadriplegia, does not follow commands, does not track, response to painful stimuli  Psychiatric:     Comments: Impaired    ED Results / Procedures / Treatments    Labs (all labs ordered are listed, but only abnormal results are displayed) Labs Reviewed - No data to display  EKG None  Radiology No results found.  Procedures FEEDING TUBE REPLACEMENT  Date/Time: 10/29/2020 10:01 AM Performed by: Carmin Muskrat, MD Authorized by: Carmin Muskrat, MD  Consent: The procedure was performed in an emergent situation. Verbal consent not obtained. Written consent not obtained. Risks and benefits discussed: nonverbal patient w dementia. Site marked: the operative site was marked Required items: required blood products, implants, devices, and special equipment available Patient identity confirmed: arm band Time out: Immediately prior to procedure a "time out" was called to verify the correct patient, procedure, equipment, support staff and site/side marked as required. Preparation: Patient was prepped and draped in the usual sterile fashion. Indications: tube dislodged Local anesthesia used: no  Anesthesia: Local anesthesia used: no  Sedation: Patient sedated: no  Tube type: gastrostomy Patient position: sitting Procedure type: replacement Tube size: 18 Fr Endoscope used: no Bulb inflation volume: 7 (ml) Bulb inflation fluid: normal saline Placement/position confirmation: auscultation and gastric contents aspirated Tube placement difficulty: moderate Patient tolerance: patient tolerated the procedure well with no immediate complications     Medications Ordered in ED Medications - No data to display  ED Course  I have reviewed the triage vital signs and the nursing notes.  Pertinent labs & imaging results that were available during my care of the patient were reviewed by me and considered in my medical decision making (see chart for details).  Adult female with multiple medical issues including baseline essentially nonverbal status, quadriplegia, dementia presents after her feeding tube was dislodged earlier in the day.  No report  of other concerns from nursing of staff, no hemodynamic instability.  Tube replaced as above without complication.  Patient returned to her nursing facility. Final Clinical Impression(s) / ED Diagnoses Final diagnoses:  Feeding tube dysfunction, initial encounter     Carmin Muskrat, MD 10/29/20 1002

## 2020-10-29 NOTE — ED Triage Notes (Signed)
Per EMS, pt from [facility], her feeding tube came out this morning. Pt hx of dementia and is nonverbal. Has not appeared to be in pain. Foley in place to g-tube to keep patent.

## 2020-11-13 ENCOUNTER — Other Ambulatory Visit: Payer: Self-pay

## 2020-11-13 ENCOUNTER — Ambulatory Visit (HOSPITAL_COMMUNITY)
Admission: RE | Admit: 2020-11-13 | Discharge: 2020-11-13 | Disposition: A | Discharge status: Home / Self Care | Source: Ambulatory Visit | Attending: Internal Medicine | Admitting: Internal Medicine

## 2020-11-13 ENCOUNTER — Other Ambulatory Visit (HOSPITAL_COMMUNITY): Payer: Self-pay | Admitting: Internal Medicine

## 2020-11-13 DIAGNOSIS — K9423 Gastrostomy malfunction: Secondary | ICD-10-CM | POA: Insufficient documentation

## 2020-11-13 DIAGNOSIS — R633 Feeding difficulties, unspecified: Secondary | ICD-10-CM | POA: Insufficient documentation

## 2020-11-13 HISTORY — PX: IR REPLC GASTRO/COLONIC TUBE PERCUT W/FLUORO: IMG2333

## 2020-11-13 MED ORDER — LIDOCAINE VISCOUS HCL 2 % MT SOLN
OROMUCOSAL | Status: AC
  Filled 2020-11-13: qty 15

## 2020-11-13 NOTE — Procedures (Signed)
Interventional Radiology Procedure Note  Procedure: G tube exchange  Indication: Fractured leaking G tube  Findings: Please refer to procedural dictation for full description.  Complications: None  EBL: < 10 mL  Acquanetta Belling, MD [phone]

## 2021-01-03 ENCOUNTER — Other Ambulatory Visit: Payer: Self-pay

## 2021-01-03 ENCOUNTER — Emergency Department (HOSPITAL_COMMUNITY)

## 2021-01-03 ENCOUNTER — Emergency Department (HOSPITAL_COMMUNITY)
Admission: EM | Admit: 2021-01-03 | Discharge: 2021-01-04 | Disposition: A | Discharge status: Home / Self Care | Attending: Emergency Medicine | Admitting: Emergency Medicine

## 2021-01-03 DIAGNOSIS — W06XXXA Fall from bed, initial encounter: Secondary | ICD-10-CM | POA: Insufficient documentation

## 2021-01-03 DIAGNOSIS — Z87891 Personal history of nicotine dependence: Secondary | ICD-10-CM | POA: Insufficient documentation

## 2021-01-03 DIAGNOSIS — F0390 Unspecified dementia without behavioral disturbance: Secondary | ICD-10-CM | POA: Diagnosis not present

## 2021-01-03 DIAGNOSIS — Z8673 Personal history of transient ischemic attack (TIA), and cerebral infarction without residual deficits: Secondary | ICD-10-CM | POA: Diagnosis not present

## 2021-01-03 DIAGNOSIS — I10 Essential (primary) hypertension: Secondary | ICD-10-CM | POA: Insufficient documentation

## 2021-01-03 DIAGNOSIS — Z79899 Other long term (current) drug therapy: Secondary | ICD-10-CM | POA: Insufficient documentation

## 2021-01-03 DIAGNOSIS — Z794 Long term (current) use of insulin: Secondary | ICD-10-CM | POA: Insufficient documentation

## 2021-01-03 DIAGNOSIS — R532 Functional quadriplegia: Secondary | ICD-10-CM | POA: Diagnosis not present

## 2021-01-03 DIAGNOSIS — E119 Type 2 diabetes mellitus without complications: Secondary | ICD-10-CM | POA: Diagnosis not present

## 2021-01-03 DIAGNOSIS — Y92129 Unspecified place in nursing home as the place of occurrence of the external cause: Secondary | ICD-10-CM | POA: Insufficient documentation

## 2021-01-03 DIAGNOSIS — Z7982 Long term (current) use of aspirin: Secondary | ICD-10-CM | POA: Insufficient documentation

## 2021-01-03 DIAGNOSIS — W19XXXA Unspecified fall, initial encounter: Secondary | ICD-10-CM

## 2021-01-03 MED ORDER — ACETAMINOPHEN 160 MG/5ML PO SOLN: 500.0000 mg | Freq: Once | ORAL | Status: DC

## 2021-01-03 MED ORDER — HYDROCODONE-ACETAMINOPHEN 5-325 MG PO TABS
1.0000 | ORAL_TABLET | Freq: Once | ORAL | Status: AC
Start: 2021-01-03 — End: 2021-01-03
  Administered 2021-01-03: 1
  Filled 2021-01-03: qty 1

## 2021-01-03 NOTE — ED Provider Notes (Signed)
[facility] DEPT Provider Note   CSN: [id number] Arrival date & time: 01/03/21  2100     History Chief Complaint  Patient presents with   Christine Franklin is a 70 y.o. female.  She fell from bed at her skilled nursing facility.  Apparently, this fall was no more than 1 foot.  She is at her baseline, but as documented, her history is limited due to her underlying neurologic disease.  The history is provided by the EMS personnel. The history is limited by the condition of the patient (Patient has expressive aphasia due to CVA).  Fall This is a new problem. The current episode started less than 1 hour ago. The problem occurs constantly. The problem has been resolved. Pertinent negatives include no chest pain, no abdominal pain, no headaches and no shortness of breath. Nothing aggravates the symptoms. Nothing relieves the symptoms. She has tried nothing for the symptoms. The treatment provided no relief.      Past Medical History:  Diagnosis Date   Anemia    Aphasia    CVA (cerebral vascular accident) ([place]) 2019   Dementia ([place])    Diabetes mellitus without complication ([place])    Dysphagia    Epilepsy ([place])    Functional quadriplegia (HCC)    Hyperlipidemia    Hypertensive heart disease    Major depressive disorder    Obesity    PVD (peripheral vascular disease) ([place])     Patient Active Problem List   Diagnosis Date Noted   Pressure injury of skin 08/19/2019   UTI (urinary tract infection) 08/18/2019   History of CVA (cerebrovascular accident) 08/18/2019   Functional quadriplegia ([place]) 08/18/2019   Dehydration with hypernatremia 08/18/2019   Epilepsy ([place])    Diabetes mellitus without complication ([place])    Dysphagia    Hyperlipidemia    Hypertensive heart disease    Cerebral thrombosis with cerebral infarction 09/06/2017   Generalized tonic-clonic seizure ([place]) 09/03/2017   Postictal state ([place]) 09/03/2017   Myoclonic jerking  09/03/2017   Normocytic anemia 09/03/2017   Acute encephalopathy 09/02/2017    Past Surgical History:  Procedure Laterality Date   IR REPLACE G-TUBE SIMPLE WO FLUORO  04/25/2020   IR REPLACE G-TUBE SIMPLE WO FLUORO  08/02/2020   IR REPLC GASTRO/COLONIC TUBE PERCUT W/FLUORO  09/10/2017   IR REPLC GASTRO/COLONIC TUBE PERCUT W/FLUORO  11/13/2020     OB History   No obstetric history on file.     No family history on file.  Social History   Tobacco Use   Smoking status: Former    Packs/day: 0.50    Years: 50.00    Pack years: 25.00    Types: Cigarettes    Quit date: 2019    Years since quitting: 3.6   Smokeless tobacco: Never  Substance Use Topics   Alcohol use: Not Currently   Drug use: Not Currently    Home Medications Prior to Admission medications   Medication Sig Start Date End Date Taking? Authorizing Provider  acetaminophen (TYLENOL) 325 MG tablet Place 650 mg into feeding tube every 12 (twelve) hours as needed (pain).     [provider]  Amino Acids-Protein Hydrolys (FEEDING SUPPLEMENT, PRO-STAT SUGAR FREE 64,) LIQD Place 30 mLs into feeding tube in the morning and at bedtime.    [provider]  ascorbic acid (VITAMIN C) 500 MG tablet Place 500 mg into feeding tube 2 (two) times daily.    [provider]  aspirin 81 MG chewable tablet Place 81 mg into feeding tube daily.    [provider]  atorvastatin (LIPITOR) 40 MG tablet Take 1 tablet (40 mg total) by mouth daily at 6 PM. Patient taking differently: Place 40 mg into feeding tube daily at 6 PM.  09/14/17   Gherghe, Vella Redhead, MD  Butalbital-APAP-Caffeine 50-300-40 MG CAPS Place 1 capsule into feeding tube 3 (three) times daily as needed (Migraine). Patient taking differently: Place 1 capsule into feeding tube 3 (three) times daily.  08/22/19   Lavina Hamman, MD  carvedilol (COREG) 6.25 MG tablet Place 1 tablet (6.25 mg total) into feeding tube 2 (two) times daily with a  meal. Patient taking differently: Place 6.25 mg into feeding tube in the morning and at bedtime. 8am, 9pm 09/14/17   Caren Griffins, MD  Dulaglutide (TRULICITY) 9.74 BU/3.8GT SOPN Inject 0.75 mg into the skin every Wednesday.     [provider]  ferrous sulfate 220 (44 Fe) MG/5ML solution Place 220 mg into feeding tube daily.    [provider]  gabapentin (NEURONTIN) 100 MG capsule 100 mg at bedtime.     [provider]  Glucagon, rDNA, (GLUCAGON EMERGENCY) 1 MG KIT Inject 1 mg into the muscle every 8 (eight) hours as needed (blood sugar less than 60).    [provider]  HYDROcodone-acetaminophen (NORCO/VICODIN) 5-325 MG tablet Place 1 tablet into feeding tube 2 (two) times daily as needed for moderate pain. Patient taking differently: Place 1 tablet into feeding tube every 12 (twelve) hours as needed (pain).  08/22/19   Lavina Hamman, MD  hydroxypropyl methylcellulose / hypromellose (ISOPTO TEARS / GONIOVISC) 2.5 % ophthalmic solution Place 1 drop into both eyes every 6 (six) hours as needed for dry eyes.     [provider]  insulin detemir (LEVEMIR) 100 UNIT/ML injection Inject 47 Units into the skin at bedtime.     [provider]  levETIRAcetam (KEPPRA) 100 MG/ML solution Place 10 mLs (1,000 mg total) into feeding tube 2 (two) times daily. 09/14/17   Caren Griffins, MD  Loperamide HCl (IMODIUM A-D) 1 MG/7.5ML LIQD Place 2 mg into feeding tube 2 (two) times daily.    [provider]  montelukast (SINGULAIR) 10 MG tablet Place 10 mg into feeding tube at bedtime.     [provider]  mupirocin ointment (BACTROBAN) 2 % Apply 1 application topically See admin instructions. Apply topically to peg tube sit every day and night shift for protocol    [provider]  Nutritional Supplements (FEEDING SUPPLEMENT, GLUCERNA 1.5 CAL,) LIQD Place 1,000 mLs into feeding tube See admin instructions. 60 ml/hr - start running at  2pm and run continuously until 10am for 20 hours feeding potential per day.    [provider]  nystatin (MYCOSTATIN/NYSTOP) powder Apply 1 application topically 2 (two) times daily. 12/22/19   McDonald, Mia A, PA-C  sertraline (ZOLOFT) 100 MG tablet Place 100 mg into feeding tube daily.    [provider]  Water For Irrigation, Sterile (FREE WATER) SOLN Place 200 mLs into feeding tube every 6 (six) hours. 08/22/19   Lavina Hamman, MD    Allergies    Ace inhibitors, Contrast media [iodinated diagnostic agents], Dust mite extract, Grapefruit extract, Lambs quarters, Penicillins, and Shellfish allergy  Review of Systems   Review of Systems  Unable to perform ROS: Patient nonverbal  Respiratory:  Negative for shortness of breath.   Cardiovascular:  Negative for  chest pain.  Gastrointestinal:  Negative for abdominal pain.  Neurological:  Negative for headaches.   Physical Exam Updated Vital Signs BP 135/90 (BP Location: Right Arm)   Pulse 60   Temp 97.7 F (36.5 C) (Oral)   Resp 20   Ht _0  (1.727 m)   Wt 85.5 kg   SpO2 98%   BMI 28.66 kg/m   Physical Exam Constitutional:      Appearance: Normal appearance.  HENT:     Head: Normocephalic and atraumatic.     Mouth/Throat:     Mouth: Mucous membranes are moist.  Eyes:     Extraocular Movements: Extraocular movements intact.     Conjunctiva/sclera: Conjunctivae normal.  Pulmonary:     Effort: Pulmonary effort is normal.  Abdominal:     General: There is no distension.     Palpations: Abdomen is soft.     Tenderness: There is no abdominal tenderness.     Comments: PEG in place at left abdomen. One of the ports was uncovered, and the patient's gown was saturated with gastric secretions. The dressing was also barely attached. I replaced the cap, and the drainage stopped. The PEG appears to be in good condition.  Musculoskeletal:        General: No deformity or signs of injury.     Comments: Spine is normal  in alignment. No step offs or deformities  Skin:    General: Skin is warm and dry.  Neurological:     Mental Status: She is alert.     Comments: Able to answer some questions, but speech is gibberish. Limited motion of all extremities due to paresis  Psychiatric:        Behavior: Behavior normal.    ED Results / Procedures / Treatments   Labs (all labs ordered are listed, but only abnormal results are displayed) Labs Reviewed - No data to display  EKG None  Radiology CT Head Wo Contrast  Result Date: 01/03/2021 CLINICAL DATA:  Head trauma. EXAM: CT HEAD WITHOUT CONTRAST TECHNIQUE: Contiguous axial images were obtained from the base of the skull through the vertex without intravenous contrast. COMPARISON:  Head CT 08/18/2019. FINDINGS: Brain: Mild age-related atrophy and chronic microvascular ischemic changes. There is no acute intracranial hemorrhage. No mass effect or midline shift no extra-axial fluid collection. Vascular: No hyperdense vessel or unexpected calcification. Skull: Normal. Negative for fracture or focal lesion. Sinuses/Orbits: The visualized paranasal sinuses and the left mastoid air cells are clear. Mild right mastoid effusions. No air-fluid level. Other: Mild scalp contusion over the vertex. IMPRESSION: 1. No acute intracranial pathology. 2. Mild age-related atrophy and chronic microvascular ischemic changes. Electronically Signed   By: Anner Crete M.D.   On: 01/03/2021 22:16    Procedures Procedures   Medications Ordered in ED Medications - No data to display  ED Course  I have reviewed the triage vital signs and the nursing notes.  Pertinent labs & imaging results that were available during my care of the patient were reviewed by me and considered in my medical decision making (see chart for details).    MDM Rules/Calculators/A&P                           Kizzie Ide presented after a minor fall.  She appeared to be at her baseline.  No obvious  signs of trauma.  CT head was obtained secondary to her inability to completely communicate.  This was within  normal limits.  No further work-up was indicated. Final Clinical Impression(s) / ED Diagnoses Final diagnoses:  Fall, initial encounter  Functional quadriplegia ([place])  History of CVA (cerebrovascular accident)    Rx / DC Orders ED Discharge Orders     None        Arnaldo Natal, MD 01/03/21 2302

## 2021-01-03 NOTE — ED Notes (Signed)
Pt found to be soiled with urine and feces upon assessment by this RN. Pt moved to a private room, pericare performed. New brief, linen, and gown applied. Purewick in place

## 2021-01-03 NOTE — ED Notes (Signed)
[facility] called for transport back to [facility]

## 2021-01-03 NOTE — ED Triage Notes (Signed)
Pt came from [facility] due to unwitnessed fall from bed, approximately a foot drop from bed to floor. No head trauma or blood thinners. Pt does not appear to be in pain. No apparent injuries. Per staff, pt is at neurologic baseline. PMH of 3 strokes and seizures.  134/80 64 bpm 18 RR 97% on RA

## 2021-02-14 ENCOUNTER — Emergency Department (HOSPITAL_COMMUNITY)

## 2021-02-14 ENCOUNTER — Emergency Department (HOSPITAL_COMMUNITY)
Admission: EM | Admit: 2021-02-14 | Discharge: 2021-02-15 | Disposition: A | Discharge status: Home / Self Care | Attending: Student | Admitting: Student

## 2021-02-14 DIAGNOSIS — Z794 Long term (current) use of insulin: Secondary | ICD-10-CM | POA: Diagnosis not present

## 2021-02-14 DIAGNOSIS — Y833 Surgical operation with formation of external stoma as the cause of abnormal reaction of the patient, or of later complication, without mention of misadventure at the time of the procedure: Secondary | ICD-10-CM | POA: Insufficient documentation

## 2021-02-14 DIAGNOSIS — K9429 Other complications of gastrostomy: Secondary | ICD-10-CM | POA: Insufficient documentation

## 2021-02-14 DIAGNOSIS — T188XXA Foreign body in other parts of alimentary tract, initial encounter: Secondary | ICD-10-CM | POA: Diagnosis not present

## 2021-02-14 DIAGNOSIS — Z87891 Personal history of nicotine dependence: Secondary | ICD-10-CM | POA: Diagnosis not present

## 2021-02-14 DIAGNOSIS — F0390 Unspecified dementia without behavioral disturbance: Secondary | ICD-10-CM | POA: Diagnosis not present

## 2021-02-14 DIAGNOSIS — I119 Hypertensive heart disease without heart failure: Secondary | ICD-10-CM | POA: Insufficient documentation

## 2021-02-14 DIAGNOSIS — Z7982 Long term (current) use of aspirin: Secondary | ICD-10-CM | POA: Insufficient documentation

## 2021-02-14 DIAGNOSIS — K9423 Gastrostomy malfunction: Secondary | ICD-10-CM

## 2021-02-14 DIAGNOSIS — E119 Type 2 diabetes mellitus without complications: Secondary | ICD-10-CM | POA: Insufficient documentation

## 2021-02-14 MED ORDER — OXYCODONE HCL 5 MG/5ML PO SOLN
5.0000 mg | Freq: Once | ORAL | Status: AC
  Administered 2021-02-14: 5 mg
  Filled 2021-02-14: qty 5

## 2021-02-14 MED ORDER — IOHEXOL 300 MG/ML  SOLN
100.0000 mL | Freq: Once | INTRAMUSCULAR | Status: AC | PRN
  Administered 2021-02-14: 30 mL

## 2021-02-14 MED ORDER — HYDROCODONE-ACETAMINOPHEN 5-325 MG PO TABS: 1.0000 | ORAL_TABLET | Freq: Once | ORAL | Status: DC

## 2021-02-14 NOTE — ED Provider Notes (Addendum)
[facility] DEPT Provider Note   CSN: [id number] Arrival date & time: 02/14/21  1453     History Chief Complaint  Patient presents with   GI Problem    Christine Franklin is a 70 y.o. female who presents the emergency department for evaluation of a G-tube problem.  Patient is currently a resident at [facility] and the white tube feed applicator tip is currently lodged at the insertion port of the patient's G-tube.  Skilled nursing facility staff unable to remove this foreign body and they sent the patient to the emergency department for G-tube replacement.   GI Problem      Past Medical History:  Diagnosis Date   Anemia    Aphasia    CVA (cerebral vascular accident) ([place]) 2019   Dementia ([place])    Diabetes mellitus without complication ([place])    Dysphagia    Epilepsy ([place])    Functional quadriplegia (HCC)    Hyperlipidemia    Hypertensive heart disease    Major depressive disorder    Obesity    PVD (peripheral vascular disease) ([place])     Patient Active Problem List   Diagnosis Date Noted   Pressure injury of skin 08/19/2019   UTI (urinary tract infection) 08/18/2019   History of CVA (cerebrovascular accident) 08/18/2019   Functional quadriplegia ([place]) 08/18/2019   Dehydration with hypernatremia 08/18/2019   Epilepsy ([place])    Diabetes mellitus without complication ([place])    Dysphagia    Hyperlipidemia    Hypertensive heart disease    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     No family history on file.  Social History   Tobacco Use   Smoking status: Former    Packs/day: 0.50    Years: 50.00    Pack years: 25.00    Types: Cigarettes    Quit date: 2019    Years since quitting: 3.7   Smokeless tobacco: Never  Substance Use Topics   Alcohol use: Not Currently   Drug use: Not Currently    Home Medications Prior to Admission medications   Medication Sig Start Date End Date Taking? Authorizing Provider  acetaminophen (TYLENOL) 325 MG tablet Place 650 mg into feeding tube every 12 (twelve) hours as needed (pain).     [provider]  Amino Acids-Protein Hydrolys (FEEDING SUPPLEMENT, PRO-STAT SUGAR FREE 64,) LIQD Place 30 mLs into feeding tube in the morning and at bedtime.    [provider]  ascorbic acid (VITAMIN C) 500 MG tablet Place 500 mg into feeding tube 2 (two) times daily.    [provider]  aspirin 81 MG chewable tablet Place 81 mg into feeding tube daily.    [provider]  atorvastatin (LIPITOR) 40 MG tablet Take 1 tablet (40 mg total) by mouth daily at 6 PM. Patient taking differently: Place 40 mg into feeding tube daily at 6 PM.  09/14/17   Caren Griffins, MD  [facility]  50-300-40 MG CAPS Place 1 capsule into feeding tube 3 (three) times daily as needed (Migraine). Patient taking differently: Place 1 capsule into feeding tube 3 (three) times daily.  08/22/19   Lavina Hamman, MD  carvedilol (COREG) 6.25 MG tablet Place 1 tablet (6.25 mg total) into feeding tube 2 (two) times daily with a meal. Patient taking differently: Place 6.25 mg into feeding tube in the morning and at bedtime. 8am, 9pm 09/14/17   Caren Griffins, MD  Dulaglutide (TRULICITY) 9.78 ER/8.4XQ SOPN Inject 0.75 mg into the skin every Wednesday.     [provider]  ferrous sulfate 220 (44 Fe) MG/5ML solution Place 220 mg into feeding tube daily.     [provider]  gabapentin (NEURONTIN) 100 MG capsule 100 mg at bedtime.     [provider]  Glucagon, rDNA, (GLUCAGON EMERGENCY) 1 MG KIT Inject 1 mg into the muscle every 8 (eight) hours as needed (blood sugar less than 60).    [provider]  HYDROcodone-acetaminophen (NORCO/VICODIN) 5-325 MG tablet Place 1 tablet into feeding tube 2 (two) times daily as needed for moderate pain. Patient taking differently: Place 1 tablet into feeding tube every 12 (twelve) hours as needed (pain).  08/22/19   Lavina Hamman, MD  hydroxypropyl methylcellulose / hypromellose (ISOPTO TEARS / GONIOVISC) 2.5 % ophthalmic solution Place 1 drop into both eyes every 6 (six) hours as needed for dry eyes.     [provider]  insulin detemir (LEVEMIR) 100 UNIT/ML injection Inject 47 Units into the skin at bedtime.     [provider]  levETIRAcetam (KEPPRA) 100 MG/ML solution Place 10 mLs (1,000 mg total) into feeding tube 2 (two) times daily. 09/14/17   Caren Griffins, MD  Loperamide HCl (IMODIUM A-D) 1 MG/7.5ML LIQD Place 2 mg into feeding tube 2 (two) times daily.    [provider]  montelukast (SINGULAIR) 10 MG tablet Place 10 mg into feeding tube at bedtime.     [provider]  mupirocin ointment (BACTROBAN) 2 % Apply 1 application topically See admin instructions. Apply topically to peg tube sit every day and night shift for protocol    [provider]  Nutritional Supplements (FEEDING SUPPLEMENT, GLUCERNA 1.5 CAL,) LIQD Place 1,000 mLs into feeding tube See admin instructions. 60 ml/hr - start running at 2pm and run continuously until 10am for 20 hours feeding potential per day.    [provider]  nystatin (MYCOSTATIN/NYSTOP) powder Apply 1 application topically 2 (two) times daily. 12/22/19   McDonald, Mia A, PA-C  sertraline (ZOLOFT) 100 MG tablet Place 100 mg into feeding tube daily.    [provider]  Water For  Irrigation, Sterile (FREE WATER) SOLN Place 200 mLs into feeding tube every 6 (six) hours. 08/22/19   Lavina Hamman, MD    Allergies    Ace inhibitors, Contrast media [iodinated diagnostic agents], Dust mite extract, Grapefruit extract, Lambs quarters, Penicillins, and Shellfish allergy  Review of Systems   Review of Systems  Unable to perform ROS: Dementia   Physical Exam Updated Vital Signs Pulse 60   Temp 97.8 F (36.6 C)   Resp 18   SpO2 95%   Physical Exam Vitals and nursing note reviewed.  Constitutional:      General: She is not in acute distress.    Appearance: She is well-developed.  HENT:     Head: Normocephalic and atraumatic.  Eyes:     Conjunctiva/sclera: Conjunctivae normal.  Cardiovascular:     Rate and Rhythm: Normal rate and regular rhythm.     Heart sounds: No murmur heard. Pulmonary:     Effort: Pulmonary effort is normal. No respiratory distress.     Breath sounds: Normal breath sounds.  Abdominal:     Palpations: Abdomen is soft.     Tenderness: There is no abdominal tenderness.     Comments: G-tube with plastic foreign body in the insertion point  Musculoskeletal:     Cervical back: Neck supple.  Skin:    General: Skin is warm and dry.     Comments: No appreciable erythema or evidence of irritation around the G-tube site  Neurological:     Mental Status: She is alert.    ED Results / Procedures / Treatments   Labs (all labs ordered are listed, but only abnormal results are displayed) Labs Reviewed - No data to display  EKG None  Radiology No results found.  Procedures Gastrostomy tube replacement  Date/Time: 02/14/2021 3:44 PM Performed by: Teressa Lower, MD Authorized by: Teressa Lower, MD  Time out: Immediately prior to procedure a "time out" was called to verify the correct patient, procedure, equipment, support staff and site/side marked as required. Preparation: Patient was prepped and draped in the usual sterile  fashion. Local anesthesia used: no  Anesthesia: Local anesthesia used: no  Sedation: Patient sedated: no  Patient tolerance: patient tolerated the procedure well with no immediate complications Comments: G-tube replaced and x-ray study confirmed placement     Medications Ordered in ED Medications - No data to display  ED Course  I have reviewed the triage vital signs and the nursing notes.  Pertinent labs & imaging results that were available during my care of the patient were reviewed by me and considered in my medical decision making (see chart for details).    MDM Rules/Calculators/A&P                           Patient seen the emergency department for evaluation of a G-tube problem.  Physical exam reveals a plastic foreign body lodged in the insertion point of the G-tube that was not retrievable at bedside.  No evidence of overlying erythema or infection around the G-tube site.  G-tube was replaced at bedside with a 20 French gastrostomy tube.  X-ray study confirms placement.  This x-ray was performed with oral contrast despite the contrast media allergy listed in the patient's chart.  On further vesication of this allergy, there is a listed "unknown reaction" and the patient has had an oral contrasted study before with no complications on 04/29/7587.  Patient will be discharged back to her facility. Final Clinical Impression(s) / ED Diagnoses Final diagnoses:  None    Rx / DC Orders ED Discharge Orders     None        Kesi Perrow, MD 02/14/21 [place]    Teressa Lower, MD 02/14/21 1621

## 2021-02-14 NOTE — ED Notes (Signed)
[facility] called for transport 

## 2021-02-14 NOTE — ED Notes (Signed)
Pt's family would like to be notified of [facility] arrival, [place] [phone]

## 2021-02-14 NOTE — ED Notes (Signed)
Pt brief changed.  

## 2021-02-14 NOTE — ED Triage Notes (Signed)
Ems bring pt in from [facility] for a g tube problem. Staff states something was pushed in the g tube and they can't get it out now.

## 2021-04-08 ENCOUNTER — Encounter (HOSPITAL_COMMUNITY): Payer: Self-pay

## 2021-04-08 ENCOUNTER — Emergency Department (HOSPITAL_COMMUNITY)
Admission: EM | Admit: 2021-04-08 | Discharge: 2021-04-09 | Disposition: A | Discharge status: Home / Self Care | Attending: Emergency Medicine | Admitting: Emergency Medicine

## 2021-04-08 ENCOUNTER — Other Ambulatory Visit: Payer: Self-pay

## 2021-04-08 ENCOUNTER — Emergency Department (HOSPITAL_COMMUNITY)

## 2021-04-08 DIAGNOSIS — Z794 Long term (current) use of insulin: Secondary | ICD-10-CM | POA: Diagnosis not present

## 2021-04-08 DIAGNOSIS — R6889 Other general symptoms and signs: Secondary | ICD-10-CM

## 2021-04-08 DIAGNOSIS — Z79899 Other long term (current) drug therapy: Secondary | ICD-10-CM | POA: Insufficient documentation

## 2021-04-08 DIAGNOSIS — Z87891 Personal history of nicotine dependence: Secondary | ICD-10-CM | POA: Insufficient documentation

## 2021-04-08 DIAGNOSIS — R109 Unspecified abdominal pain: Secondary | ICD-10-CM

## 2021-04-08 DIAGNOSIS — F0390 Unspecified dementia without behavioral disturbance: Secondary | ICD-10-CM | POA: Insufficient documentation

## 2021-04-08 DIAGNOSIS — I509 Heart failure, unspecified: Secondary | ICD-10-CM | POA: Diagnosis not present

## 2021-04-08 DIAGNOSIS — I110 Hypertensive heart disease with heart failure: Secondary | ICD-10-CM | POA: Diagnosis not present

## 2021-04-08 DIAGNOSIS — Z7982 Long term (current) use of aspirin: Secondary | ICD-10-CM | POA: Insufficient documentation

## 2021-04-08 DIAGNOSIS — E119 Type 2 diabetes mellitus without complications: Secondary | ICD-10-CM | POA: Insufficient documentation

## 2021-04-08 DIAGNOSIS — K9423 Gastrostomy malfunction: Secondary | ICD-10-CM | POA: Diagnosis not present

## 2021-04-08 MED ORDER — GABAPENTIN 100 MG PO CAPS
100.0000 mg | ORAL_CAPSULE | Freq: Once | ORAL | Status: AC
  Administered 2021-04-08: 100 mg via ORAL
  Filled 2021-04-08: qty 1

## 2021-04-08 MED ORDER — HYDROCODONE-ACETAMINOPHEN 5-325 MG PO TABS
1.0000 | ORAL_TABLET | Freq: Once | ORAL | Status: AC
  Administered 2021-04-08: 1
  Filled 2021-04-08: qty 1

## 2021-04-08 MED ORDER — IOHEXOL 300 MG/ML  SOLN
30.0000 mL | Freq: Once | INTRAMUSCULAR | Status: AC | PRN
  Administered 2021-04-08: 30 mL

## 2021-04-08 NOTE — Discharge Instructions (Signed)
Return for any problem.  ?

## 2021-04-08 NOTE — ED Provider Notes (Signed)
[facility] DEPT Provider Note   CSN: [id number] Arrival date & time: 04/08/21  1135     History Chief Complaint  Patient presents with   GTube Replacement    Christine Franklin is a 70 y.o. female.  70 year old female with prior medical history as detailed below presents for evaluation.  Patient with G-tube that was accidentally removed by herself earlier today.  Patient with 43 [place] G-tube.  G-tube has been out for an uncertain period of time.  Patient was sent in for replacement of G-tube.  Level 5 caveat.  Patient with advanced dementia and is essentially nonverbal.  She appears to be in no distress.  The history is provided by the patient.  Illness Location:  G-tube inadvertently removed, needs replacement Severity:  Mild Onset quality:  Unable to specify Timing:  Unable to specify Progression:  Unable to specify     Past Medical History:  Diagnosis Date   Anemia    Aphasia    CVA (cerebral vascular accident) ([place]) 2019   Dementia ([place])    Diabetes mellitus without complication ([place])    Dysphagia    Epilepsy ([place])    Functional quadriplegia ([place])    Hyperlipidemia    Hypertensive heart disease    Major depressive disorder    Obesity    PVD (peripheral vascular disease) ([place])     Patient Active Problem List   Diagnosis Date Noted   Pressure injury of skin 08/19/2019   UTI (urinary tract infection) 08/18/2019   History of CVA (cerebrovascular accident) 08/18/2019   Functional quadriplegia ([place]) 08/18/2019   Dehydration with hypernatremia 08/18/2019   Epilepsy ([place])    Diabetes mellitus without complication ([place])    Dysphagia    Hyperlipidemia    Hypertensive heart disease    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     History reviewed. No pertinent family history.  Social History   Tobacco Use   Smoking status: Former    Packs/day: 0.50    Years: 50.00    Pack years: 25.00    Types: Cigarettes    Quit date: 2019    Years since quitting: 3.9   Smokeless tobacco: Never  Substance Use Topics   Alcohol use: Not Currently   Drug use: Not Currently    Home Medications Prior to Admission medications   Medication Sig Start Date End Date Taking? Authorizing Provider  acetaminophen (TYLENOL) 325 MG tablet Place 650 mg into feeding tube every 12 (twelve) hours as needed (pain).     [provider]  Amino Acids-Protein Hydrolys (FEEDING SUPPLEMENT, PRO-STAT SUGAR FREE 64,) LIQD Place 30 mLs into feeding tube in the morning and at bedtime.    [provider]  ascorbic acid (VITAMIN C) 500 MG tablet Place 500 mg into feeding tube 2 (two) times daily.    [provider]  aspirin 81 MG chewable tablet Place 81 mg into feeding tube daily.    [provider]  atorvastatin (LIPITOR) 40 MG tablet Take 1 tablet (40 mg total) by mouth daily at 6 PM. Patient taking differently: Place 40 mg into feeding tube daily at 6 PM.  09/14/17   Gherghe, Vella Redhead, MD  Butalbital-APAP-Caffeine 50-300-40 MG CAPS Place 1 capsule into feeding tube 3 (three) times daily as needed (Migraine). Patient taking differently: Place 1 capsule into feeding tube 3 (three) times daily.  08/22/19   Lavina Hamman, MD  carvedilol (COREG) 6.25 MG tablet Place 1 tablet (6.25 mg total) into feeding tube 2 (two) times daily with a meal. Patient taking differently: Place 6.25 mg into feeding tube in the  morning and at bedtime. 8am, 9pm 09/14/17   Caren Griffins, MD  Dulaglutide (TRULICITY) 6.96 EX/5.2WU SOPN Inject 0.75 mg into the skin every Wednesday.     [provider]  ferrous sulfate 220 (44 Fe) MG/5ML solution Place 220 mg into feeding tube daily.    [provider]  gabapentin (NEURONTIN) 100 MG capsule 100 mg at bedtime.     [provider]  Glucagon, rDNA, (GLUCAGON EMERGENCY) 1 MG KIT Inject 1 mg into the muscle every 8 (eight) hours as needed (blood sugar less than 60).    [provider]  HYDROcodone-acetaminophen (NORCO/VICODIN) 5-325 MG tablet Place 1 tablet into feeding tube 2 (two) times daily as needed for moderate pain. Patient taking differently: Place 1 tablet into feeding tube every 12 (twelve) hours as needed (pain).  08/22/19   Lavina Hamman, MD  hydroxypropyl methylcellulose / hypromellose (ISOPTO TEARS / GONIOVISC) 2.5 % ophthalmic solution Place 1 drop into both eyes every 6 (six) hours as needed for dry eyes.     [provider]  insulin detemir (LEVEMIR) 100 UNIT/ML injection Inject 47 Units into the skin at bedtime.     [provider]  levETIRAcetam (KEPPRA) 100 MG/ML solution Place 10 mLs (1,000 mg total) into feeding tube 2 (two) times daily. 09/14/17   Caren Griffins, MD  Loperamide HCl (IMODIUM A-D) 1 MG/7.5ML LIQD Place 2 mg into feeding tube 2 (two) times daily.    [provider]  montelukast (SINGULAIR) 10 MG tablet Place 10 mg into feeding tube at bedtime.     [provider]  mupirocin ointment (BACTROBAN) 2 % Apply 1 application topically See admin instructions. Apply topically to peg tube sit every day and night shift for protocol    [provider]  Nutritional Supplements (FEEDING SUPPLEMENT, GLUCERNA 1.5 CAL,) LIQD Place 1,000 mLs into feeding tube See admin instructions. 60 ml/hr - start running at 2pm and run continuously until 10am for 20 hours feeding potential per day.     [provider]  nystatin (MYCOSTATIN/NYSTOP) powder Apply 1 application topically 2 (two) times daily. 12/22/19   McDonald, Mia A, PA-C  sertraline (ZOLOFT) 100 MG tablet Place 100 mg into feeding tube daily.    [provider]  Water For Irrigation, Sterile (FREE WATER) SOLN Place 200 mLs into feeding tube every 6 (six) hours. 08/22/19   Lavina Hamman, MD    Allergies    Ace inhibitors, Contrast media [iodinated diagnostic agents], Dust mite extract, Grapefruit extract, Lambs quarters, Penicillins, and Shellfish allergy  Review of Systems   Review of Systems  All other systems reviewed and are negative.  Physical Exam Updated Vital Signs BP (!) 160/98   Pulse 79   Temp 98.5 F (36.9 C) (Axillary)   Resp 16 Comment: 16  Ht 5'  6" (1.676 m)   Wt 85 kg   SpO2 100%   BMI 30.25 kg/m   Physical Exam Vitals and nursing note reviewed.  Constitutional:      General: She is not in acute distress.    Appearance: Normal appearance. She is well-developed.  HENT:     Head: Normocephalic and atraumatic.  Eyes:     Conjunctiva/sclera: Conjunctivae normal.     Pupils: Pupils are equal, round, and reactive to light.  Cardiovascular:     Rate and Rhythm: Normal rate and regular rhythm.     Heart sounds: Normal heart sounds.  Pulmonary:     Effort: Pulmonary effort is normal. No respiratory distress.     Breath sounds: Normal breath sounds.  Abdominal:     General: There is no distension.     Palpations: Abdomen is soft.     Tenderness: There is no abdominal tenderness.     Comments: G-tube insertion site is clean and dry.  20 [place] G-tube reinserted without difficulty.  Patient tolerated well.  Confirmatory imaging obtained.  Musculoskeletal:        General: No deformity. Normal range of motion.     Cervical back: Normal range of motion and neck supple.  Skin:    General: Skin is warm and dry.  Neurological:     Mental Status: She is alert and oriented to  person, place, and time. Mental status is at baseline.    ED Results / Procedures / Treatments   Labs (all labs ordered are listed, but only abnormal results are displayed) Labs Reviewed - No data to display  EKG None  Radiology No results found.  Procedures Gastrostomy tube replacement  Date/Time: 04/08/2021 1:14 PM Performed by: Valarie Merino, MD Authorized by: Valarie Merino, MD  Consent: Verbal consent obtained. Site marked: the operative site was marked Imaging studies: imaging studies available Local anesthesia used: no  Anesthesia: Local anesthesia used: no  Sedation: Patient sedated: no  Patient tolerance: patient tolerated the procedure well with no immediate complications     Medications Ordered in ED Medications - No data to display  ED Course  I have reviewed the triage vital signs and the nursing notes.  Pertinent labs & imaging results that were available during my care of the patient were reviewed by me and considered in my medical decision making (see chart for details).    MDM Rules/Calculators/A&P                           MDM  MSE complete  Christine Franklin was evaluated in Emergency Department on 04/08/2021 for the symptoms described in the history of present illness. She was evaluated in the context of the global COVID-19 pandemic, which necessitated consideration that the patient might be at risk for infection with the SARS-CoV-2 virus that causes COVID-19. Institutional protocols and algorithms that pertain to the evaluation of patients at risk for COVID-19 are in a state of rapid change based on information released by regulatory bodies including the CDC and federal and state organizations. These policies and algorithms were followed during the patient's care in the ED.  Patient sent in for G-tube replacement  20 French G-tube replaced by myself without difficulty.  Patient tolerated this well.    Confirmatory x-ray  obtained  Patient is appropriate for further outpatient management.   Final Clinical Impression(s) / ED Diagnoses Final diagnoses:  Abdominal pain  Complaint associated with gastric tube (  [facility])    Rx / DC Orders ED Discharge Orders     None        Valarie Merino, MD 04/08/21 1315

## 2021-04-08 NOTE — ED Notes (Signed)
Pt had a bowel movement, changed pt. She has pressure sores inbetween her legs and on her rear maximus.

## 2021-04-08 NOTE — ED Notes (Addendum)
ED RN attempted to call report to facility, no RN available to take report at this time.

## 2021-04-08 NOTE — ED Notes (Signed)
ED RN attempted to call report to facility. No RN available to take report at this time.  Voicemail left on director phone requesting call back.

## 2021-04-08 NOTE — ED Notes (Signed)
ED RN attempted to call report to facility. No RN available to take report at this time.

## 2021-04-08 NOTE — ED Triage Notes (Signed)
Pt bib [facility] for G-tube replacement. Pt A&Ox1(self) hx of dementia, pulled G-tube out.  Pt sent here for replacement.  20 French tube. Pt hx of quadriplegic

## 2021-04-09 NOTE — ED Notes (Signed)
[facility] on unit for transport back to facility at this time

## 2021-07-01 ENCOUNTER — Other Ambulatory Visit: Payer: Self-pay

## 2021-07-01 ENCOUNTER — Encounter (HOSPITAL_COMMUNITY): Payer: Self-pay | Admitting: Emergency Medicine

## 2021-07-01 ENCOUNTER — Emergency Department (HOSPITAL_COMMUNITY)
Admission: EM | Admit: 2021-07-01 | Discharge: 2021-07-01 | Disposition: A | Discharge status: Home / Self Care | Attending: Emergency Medicine | Admitting: Emergency Medicine

## 2021-07-01 ENCOUNTER — Emergency Department (HOSPITAL_COMMUNITY)

## 2021-07-01 DIAGNOSIS — Z7982 Long term (current) use of aspirin: Secondary | ICD-10-CM | POA: Diagnosis not present

## 2021-07-01 DIAGNOSIS — Z794 Long term (current) use of insulin: Secondary | ICD-10-CM | POA: Diagnosis not present

## 2021-07-01 DIAGNOSIS — K9423 Gastrostomy malfunction: Secondary | ICD-10-CM | POA: Insufficient documentation

## 2021-07-01 DIAGNOSIS — Z79899 Other long term (current) drug therapy: Secondary | ICD-10-CM | POA: Diagnosis not present

## 2021-07-01 DIAGNOSIS — F0390 Unspecified dementia without behavioral disturbance: Secondary | ICD-10-CM | POA: Diagnosis not present

## 2021-07-01 MED ORDER — IOHEXOL 300 MG/ML  SOLN
50.0000 mL | Freq: Once | INTRAMUSCULAR | Status: AC | PRN
  Administered 2021-07-01: 50 mL

## 2021-07-01 MED ORDER — HYDROCODONE-ACETAMINOPHEN 5-325 MG PO TABS
2.0000 | ORAL_TABLET | Freq: Once | ORAL | Status: AC
  Administered 2021-07-01: 2 via ORAL
  Filled 2021-07-01: qty 2

## 2021-07-01 NOTE — ED Notes (Signed)
Patient transported to X-ray 

## 2021-07-01 NOTE — ED Provider Notes (Signed)
[place] DEPT Provider Note   CSN: [id number] Arrival date & time: 07/01/21  1107     History  Chief Complaint  Patient presents with   GI Problem    Christine Franklin is a 71 y.o. female with prior medical history as detailed below presents for evaluation.  Patient with G-tube that was cut accidentally and is now leaking food.  Patient with 36 [place] G-tube.  G-tube has been in place since last visit to the ED 04/08/21.  Patient was sent in for replacement of G-tube.  Level 5 caveat.  Patient with advanced dementia and is not alert and oriented, but is at baseline per staff.  She appears to be in no distress.   GI Problem      Home Medications Prior to Admission medications   Medication Sig Start Date End Date Taking? Authorizing Provider  acetaminophen (TYLENOL) 325 MG tablet Place 650 mg into feeding tube every 12 (twelve) hours as needed (pain).     [provider]  Amino Acids-Protein Hydrolys (FEEDING SUPPLEMENT, PRO-STAT SUGAR FREE 64,) LIQD Place 30 mLs into feeding tube in the morning and at bedtime.    [provider]  ascorbic acid (VITAMIN C) 500 MG tablet Place 500 mg into feeding tube 2 (two) times daily.    [provider]  aspirin 81 MG chewable tablet Place 81 mg into feeding tube daily.    [provider]  atorvastatin (LIPITOR) 40 MG tablet Take 1 tablet (40 mg total) by mouth daily at 6 PM. Patient taking differently: Place 40 mg into feeding tube daily at 6 PM.  09/14/17   Gherghe, Vella Redhead, MD  Butalbital-APAP-Caffeine 50-300-40 MG CAPS Place 1 capsule into feeding tube 3 (three) times daily as needed (Migraine). Patient taking differently: Place 1 capsule into feeding tube 3 (three) times daily.  08/22/19   Lavina Hamman, MD  carvedilol (COREG) 6.25 MG tablet Place 1 tablet (6.25 mg total) into feeding tube 2 (two) times daily with a meal. Patient taking differently: Place 6.25 mg into feeding  tube in the morning and at bedtime. 8am, 9pm 09/14/17   Caren Griffins, MD  Dulaglutide (TRULICITY) 6.29 BM/8.4XL SOPN Inject 0.75 mg into the skin every Wednesday.     [provider]  ferrous sulfate 220 (44 Fe) MG/5ML solution Place 220 mg into feeding tube daily.    [provider]  gabapentin (NEURONTIN) 100 MG capsule 100 mg at bedtime.     [provider]  Glucagon, rDNA, (GLUCAGON EMERGENCY) 1 MG KIT Inject 1 mg into the muscle every 8 (eight) hours as needed (blood sugar less than 60).    [provider]  HYDROcodone-acetaminophen (NORCO/VICODIN) 5-325 MG tablet Place 1 tablet into feeding tube 2 (two) times daily as needed for moderate pain. Patient taking differently: Place 1 tablet into feeding tube every 12 (twelve) hours as needed (pain).  08/22/19   Lavina Hamman, MD  hydroxypropyl methylcellulose / hypromellose (ISOPTO TEARS / GONIOVISC) 2.5 % ophthalmic solution Place 1 drop into both eyes every 6 (six) hours as needed for dry eyes.     [provider]  insulin detemir (LEVEMIR) 100 UNIT/ML injection Inject 47 Units into the skin at bedtime.     [provider]  levETIRAcetam (KEPPRA) 100 MG/ML solution Place 10 mLs (1,000 mg total) into feeding tube 2 (two) times daily. 09/14/17   Caren Griffins, MD  Loperamide HCl (IMODIUM A-D) 1 MG/7.5ML LIQD Place  2 mg into feeding tube 2 (two) times daily.    [provider]  montelukast (SINGULAIR) 10 MG tablet Place 10 mg into feeding tube at bedtime.     [provider]  mupirocin ointment (BACTROBAN) 2 % Apply 1 application topically See admin instructions. Apply topically to peg tube sit every day and night shift for protocol    [provider]  Nutritional Supplements (FEEDING SUPPLEMENT, GLUCERNA 1.5 CAL,) LIQD Place 1,000 mLs into feeding tube See admin instructions. 60 ml/hr - start running at 2pm and run continuously until 10am for 20 hours feeding  potential per day.    [provider]  nystatin (MYCOSTATIN/NYSTOP) powder Apply 1 application topically 2 (two) times daily. 12/22/19   McDonald, Mia A, PA-C  sertraline (ZOLOFT) 100 MG tablet Place 100 mg into feeding tube daily.    [provider]  Water For Irrigation, Sterile (FREE WATER) SOLN Place 200 mLs into feeding tube every 6 (six) hours. 08/22/19   Lavina Hamman, MD      Allergies    Ace inhibitors, Contrast media [iodinated contrast media], Dust mite extract, Grapefruit extract, Lambs quarters, Penicillins, and Shellfish allergy    Review of Systems   Review of Systems  Reason unable to perform ROS: advanced dementia.   Physical Exam Updated Vital Signs BP (!) 186/121    Pulse 78    Temp 98.1 F (36.7 C) (Axillary)    Resp 18    Ht _0  (1.676 m)    Wt 74.8 kg    SpO2 100%    BMI 26.63 kg/m  Physical Exam Vitals and nursing note reviewed.  Constitutional:      General: She is not in acute distress.    Appearance: Normal appearance.  HENT:     Head: Normocephalic and atraumatic.  Eyes:     General:        Right eye: No discharge.        Left eye: No discharge.  Cardiovascular:     Rate and Rhythm: Normal rate and regular rhythm.     Heart sounds: No murmur heard.   No friction rub. No gallop.  Pulmonary:     Effort: Pulmonary effort is normal.     Breath sounds: Normal breath sounds.  Abdominal:     General: Bowel sounds are normal.     Palpations: Abdomen is soft.     Comments: G-tube in place, insertion site clean, no tenderness to palpation surrounding abdomen.  No induration, cellulitis noted.  Skin:    General: Skin is warm and dry.     Capillary Refill: Capillary refill takes less than 2 seconds.  Neurological:     Mental Status: She is alert. Mental status is at baseline.  Psychiatric:        Mood and Affect: Mood normal.        Behavior: Behavior normal.    ED Results / Procedures / Treatments   Labs (all labs ordered are  listed, but only abnormal results are displayed) Labs Reviewed - No data to display  EKG None  Radiology DG Abdomen 1 View  Result Date: 07/01/2021 CLINICAL DATA:  G-tube placement EXAM: ABDOMEN - 1 VIEW COMPARISON:  Portable exam 1407 hours compared to 04/08/2021 FINDINGS: Gastrografin was injected into the gastrostomy tube. Contrast opacifies the gastric lumen, duodenal bulb and duodenal C loop. No contrast extravasation. IMPRESSION: Gastrostomy tube is located within the stomach. Electronically Signed   By: Crist Infante.D.  On: 07/01/2021 14:18    Procedures Gastrostomy tube replacement  Date/Time: 07/01/2021 2:35 PM Performed by: Anselmo Pickler, PA-C Authorized by: Anselmo Pickler, PA-C  Consent: Verbal consent obtained. Written consent obtained. Risks and benefits: risks, benefits and alternatives were discussed Consent given by: power of attorney Patient understanding: patient states understanding of the procedure being performed Patient identity confirmed: arm band      Medications Ordered in ED Medications  iohexol (OMNIPAQUE) 300 MG/ML solution 50 mL (50 mLs Per Tube Contrast Given 07/01/21 1413)    ED Course/ Medical Decision Making/ A&P                           Medical Decision Making Amount and/or Complexity of Data Reviewed Radiology: ordered.  Risk Prescription drug management.   I discussed this case with my attending physician who cosigned this note including patient's presenting symptoms, physical exam, and planned diagnostics and interventions. Attending physician stated agreement with plan or made changes to plan which were implemented.   Attending physician assessed patient at bedside.  The patient was sent in by her facility for G-tube placement, she has no other concerns today.  She is alert and oriented only to herself which is her baseline.  She is pleasantly demented.  She has no tenderness to palpation of the abdomen, she is  afebrile, she is markedly hypertensive systolic at 680, but no other signs of endorgan damage.  We replaced her G-tube as described above, confirmed placement with radiography.  G-tube is 20 [place].  She is discharged back to her facility in good condition at this time.  She can return to her facility for further outpatient management. Final Clinical Impression(s) / ED Diagnoses Final diagnoses:  Gastrostomy tube dysfunction [facility])    Rx / DC Orders ED Discharge Orders     None         Dorien Chihuahua 07/01/21 1436    Pattricia Boss, MD 07/03/21 1332

## 2021-07-01 NOTE — ED Notes (Signed)
Pt brief changed.  

## 2021-07-01 NOTE — ED Notes (Signed)
Attempted to call report to facility times 3 each time was hung up on. Called daughter and informed her her mom is on the way back to facility

## 2021-07-01 NOTE — ED Notes (Signed)
[facility] contacted for ride back to [facility]

## 2021-07-01 NOTE — ED Triage Notes (Signed)
Pt BIB EMS from [facility] d/t broken PEG tube. Pt A&O with confusions. VSS

## 2021-07-01 NOTE — ED Notes (Signed)
Daughter at bedside. No signs of distress.

## 2021-07-01 NOTE — ED Notes (Signed)
When I initially called [facility] they said this patient was 13 in line to go home. I just called them back at daughter's request and she in 9th in line to go home now.

## 2021-07-01 NOTE — Discharge Instructions (Addendum)
This patient had her G-tube exchanged, it was confirmed to be in the correct placement on x-ray.  We had no other concerns based on her presentation today, she seems to be cognitively at her baseline.  If you have any concerns about her health please have her return to the emergency department for further evaluation.

## 2021-07-01 NOTE — Progress Notes (Signed)
[employer] [facility]) Hospital Liaison Note       This patient is a current hospice patient with [facility], admitted with a terminal diagnosis of cerebrovascular disease.     [facility] will continue to follow for any discharge planning needs and to coordinate continuation of hospice care.   Please don't hesitate to call with any Hospice related questions or concerns.    Thank you for the opportunity to participate in this patient's care.  [facility] Liaison [phone]

## 2021-08-23 ENCOUNTER — Other Ambulatory Visit: Payer: Self-pay

## 2021-08-23 ENCOUNTER — Encounter (HOSPITAL_COMMUNITY): Payer: Self-pay

## 2021-08-23 ENCOUNTER — Emergency Department (HOSPITAL_COMMUNITY)
Admission: EM | Admit: 2021-08-23 | Discharge: 2021-08-23 | Disposition: A | Discharge status: Home / Self Care | Payer: Commercial Managed Care - HMO | Attending: Emergency Medicine | Admitting: Emergency Medicine

## 2021-08-23 DIAGNOSIS — Z7901 Long term (current) use of anticoagulants: Secondary | ICD-10-CM | POA: Insufficient documentation

## 2021-08-23 DIAGNOSIS — T85528A Displacement of other gastrointestinal prosthetic devices, implants and grafts, initial encounter: Secondary | ICD-10-CM

## 2021-08-23 DIAGNOSIS — Z431 Encounter for attention to gastrostomy: Secondary | ICD-10-CM | POA: Diagnosis present

## 2021-08-23 MED ORDER — LIDOCAINE HCL URETHRAL/MUCOSAL 2 % EX GEL
1.0000 "application " | Freq: Once | CUTANEOUS | Status: AC
  Administered 2021-08-23: 1 via TOPICAL
  Filled 2021-08-23: qty 11

## 2021-08-23 NOTE — ED Triage Notes (Signed)
Patient sent in by facility due to g tube coming out. Unsure how it came out. Patient has Dementia and confusion, unable to tell nurse what happen to tube. Patient is alert and pleasant, no signs of pain or distress.  ?

## 2021-08-23 NOTE — ED Provider Notes (Signed)
?[place] DEPT ?Provider Note ? ? ?CSN: [id number] ?Arrival date & time: 08/23/21  1127 ? ?  ? ?History ? ?No chief complaint on file. ? ? ?Christine Franklin is a 71 y.o. female who presents with a cc g tube displacement. The patient is unable to provide a hx due to dementia. Per her SNF-  patient's gtube was dislodged sometime overnight. Per EMR last gtube wasa french 54. ? ?HPI ? ?  ? ?Home Medications ?Prior to Admission medications   ?Medication Sig Start Date End Date Taking? Authorizing Provider  ?acetaminophen (TYLENOL) 325 MG tablet Place 650 mg into feeding tube every 12 (twelve) hours as needed (pain).     [provider]  ?Amino Acids-Protein Hydrolys (FEEDING SUPPLEMENT, PRO-STAT SUGAR FREE 64,) LIQD Place 30 mLs into feeding tube in the morning and at bedtime.    [provider]  ?ascorbic acid (VITAMIN C) 500 MG tablet Place 500 mg into feeding tube 2 (two) times daily.    [provider]  ?aspirin 81 MG chewable tablet Place 81 mg into feeding tube daily.    [provider]  ?atorvastatin (LIPITOR) 40 MG tablet Take 1 tablet (40 mg total) by mouth daily at 6 PM. ?Patient taking differently: Place 40 mg into feeding tube daily at 6 PM.  09/14/17   Gherghe, Vella Redhead, MD  ?Butalbital-APAP-Caffeine 50-300-40 MG CAPS Place 1 capsule into feeding tube 3 (three) times daily as needed (Migraine). ?Patient taking differently: Place 1 capsule into feeding tube 3 (three) times daily.  08/22/19   Lavina Hamman, MD  ?carvedilol (COREG) 6.25 MG tablet Place 1 tablet (6.25 mg total) into feeding tube 2 (two) times daily with a meal. ?Patient taking differently: Place 6.25 mg into feeding tube in the morning and at bedtime. 8am, 9pm 09/14/17   Caren Griffins, MD  ?Dulaglutide (TRULICITY) 9.79 YI/0.1KP SOPN Inject 0.75 mg into the skin every Wednesday.     [provider]  ?ferrous sulfate 220 (44 Fe) MG/5ML solution Place 220 mg into feeding  tube daily.    [provider]  ?gabapentin (NEURONTIN) 100 MG capsule 100 mg at bedtime.     [provider]  ?Glucagon, rDNA, (GLUCAGON EMERGENCY) 1 MG KIT Inject 1 mg into the muscle every 8 (eight) hours as needed (blood sugar less than 60).    [provider]  ?HYDROcodone-acetaminophen (NORCO/VICODIN) 5-325 MG tablet Place 1 tablet into feeding tube 2 (two) times daily as needed for moderate pain. ?Patient taking differently: Place 1 tablet into feeding tube every 12 (twelve) hours as needed (pain).  08/22/19   Lavina Hamman, MD  ?hydroxypropyl methylcellulose / hypromellose (ISOPTO TEARS / GONIOVISC) 2.5 % ophthalmic solution Place 1 drop into both eyes every 6 (six) hours as needed for dry eyes.     [provider]  ?insulin detemir (LEVEMIR) 100 UNIT/ML injection Inject 47 Units into the skin at bedtime.     [provider]  ?levETIRAcetam (KEPPRA) 100 MG/ML solution Place 10 mLs (1,000 mg total) into feeding tube 2 (two) times daily. 09/14/17   Caren Griffins, MD  ?Loperamide HCl (IMODIUM A-D) 1 MG/7.5ML LIQD Place 2 mg into feeding tube 2 (two) times daily.    [provider]  ?montelukast (SINGULAIR) 10 MG tablet Place 10 mg into feeding tube at bedtime.     [provider]  ?mupirocin ointment (BACTROBAN) 2 % Apply 1 application topically See admin instructions. Apply topically to peg  tube sit every day and night shift for protocol    [provider]  ?Nutritional Supplements (FEEDING SUPPLEMENT, GLUCERNA 1.5 CAL,) LIQD Place 1,000 mLs into feeding tube See admin instructions. 60 ml/hr - start running at 2pm and run continuously until 10am for 20 hours feeding potential per day.    [provider]  ?nystatin (MYCOSTATIN/NYSTOP) powder Apply 1 application topically 2 (two) times daily. 12/22/19   McDonald, Mia A, PA-C  ?sertraline (ZOLOFT) 100 MG tablet Place 100 mg into feeding tube daily.    [provider]   ?Water For Irrigation, Sterile (FREE WATER) SOLN Place 200 mLs into feeding tube every 6 (six) hours. 08/22/19   Lavina Hamman, MD  ?   ? ?Allergies    ?Ace inhibitors, Contrast media [iodinated contrast media], Dust mite extract, Grapefruit extract, Lambs quarters, Penicillins, and Shellfish allergy   ? ?Review of Systems   ?Review of Systems ? ?Physical Exam ?Updated Vital Signs ?BP (!) 156/95   Pulse 61   Temp 97.6 ?F (36.4 ?C) (Rectal)   Resp 13   SpO2 100%  ?Physical Exam ?Vitals and nursing note reviewed.  ?Constitutional:   ?   General: She is not in acute distress. ?   Appearance: She is well-developed. She is not diaphoretic.  ?HENT:  ?   Head: Normocephalic and atraumatic.  ?   Right Ear: External ear normal.  ?   Left Ear: External ear normal.  ?   Nose: Nose normal.  ?   Mouth/Throat:  ?   Mouth: Mucous membranes are moist.  ?Eyes:  ?   General: No scleral icterus. ?   Conjunctiva/sclera: Conjunctivae normal.  ?Cardiovascular:  ?   Rate and Rhythm: Normal rate and regular rhythm.  ?   Heart sounds: Normal heart sounds. No murmur heard. ?  No friction rub. No gallop.  ?Pulmonary:  ?   Effort: Pulmonary effort is normal. No respiratory distress.  ?   Breath sounds: Normal breath sounds.  ?Abdominal:  ?   General: Bowel sounds are normal. There is no distension.  ?   Palpations: Abdomen is soft. There is no mass.  ?   Tenderness: There is no abdominal tenderness. There is no guarding.  ?   Comments: Ostomy site in the LUQ   ?Musculoskeletal:  ?   Cervical back: Normal range of motion.  ?Skin: ?   General: Skin is warm and dry.  ?Neurological:  ?   Mental Status: She is alert and oriented to person, place, and time.  ?Psychiatric:     ?   Behavior: Behavior normal.  ? ? ?ED Results / Procedures / Treatments   ?Labs ?(all labs ordered are listed, but only abnormal results are displayed) ?Labs Reviewed - No data to display ? ?EKG ?None ? ?Radiology ?No results found. ? ?Procedures ?FEEDING TUBE  REPLACEMENT ? ?Date/Time: 08/23/2021 5:42 PM ?Performed by: Margarita Mail, PA-C ?Authorized by: Margarita Mail, PA-C  ?Patient identity confirmed: arm band ?Time out: Immediately prior to procedure a "time out" was called to verify the correct patient, procedure, equipment, support staff and site/side marked as required. ?Preparation: Patient was prepped and draped in the usual sterile fashion. ?Local anesthesia used: yes ?Anesthesia method: Topical lidocaine gel. ? ?Anesthesia: ?Local anesthesia used: yes ?Tube type: gastrostomy ?Patient position: supine ?Procedure type: replacement ?Tube size: 16 Fr ?Bulb inflation volume: 10 (ml) ?Bulb inflation fluid: normal saline ?Placement/position confirmation: auscultation and gastric contents aspirated ?Tube placement difficulty: minimal ?Patient tolerance:  patient tolerated the procedure well with no immediate complications ? ?  ? ? ?Medications Ordered in ED ?Medications  ?lidocaine (XYLOCAINE) 2 % jelly 1 application. (has no administration in time range)  ? ? ?ED Course/ Medical Decision Making/ A&P ? Patient G tube fell out. ?Successfully replaced. Safe for discharge. ?                        ? ? ? ?Final Clinical Impression(s) / ED Diagnoses ?Final diagnoses:  ?None  ? ? ?Rx / DC Orders ?ED Discharge Orders   ? ? None  ? ?  ? ? ?  ?Margarita Mail, PA-C ?08/23/21 1745 ? ?  ?Valarie Merino, MD ?08/27/21 1402 ? ?

## 2021-08-23 NOTE — ED Notes (Signed)
Attempted to call report to facility. No answer.  

## 2021-08-23 NOTE — ED Notes (Signed)
[facility] called and transport back to facility arranged. Pt 5th on list. ?

## 2021-08-23 NOTE — ED Notes (Signed)
Attending at bedside for gtube replacement. JRPRN ?

## 2021-08-23 NOTE — Discharge Instructions (Addendum)
G tube is in place.  ?

## 2021-12-09 ENCOUNTER — Other Ambulatory Visit (HOSPITAL_COMMUNITY): Payer: Self-pay

## 2021-12-09 DIAGNOSIS — R633 Feeding difficulties, unspecified: Secondary | ICD-10-CM

## 2021-12-10 ENCOUNTER — Encounter (HOSPITAL_COMMUNITY): Payer: Self-pay

## 2021-12-10 ENCOUNTER — Other Ambulatory Visit (HOSPITAL_COMMUNITY): Payer: Self-pay

## 2021-12-10 ENCOUNTER — Ambulatory Visit (HOSPITAL_COMMUNITY)
Admission: RE | Admit: 2021-12-10 | Discharge: 2021-12-10 | Disposition: A | Discharge status: Home / Self Care | Payer: Commercial Managed Care - HMO | Source: Ambulatory Visit | Attending: Interventional Radiology | Admitting: Interventional Radiology

## 2021-12-10 DIAGNOSIS — R633 Feeding difficulties, unspecified: Secondary | ICD-10-CM

## 2021-12-10 DIAGNOSIS — Z431 Encounter for attention to gastrostomy: Secondary | ICD-10-CM | POA: Diagnosis present

## 2021-12-10 HISTORY — PX: IR PATIENT EVAL TECH 0-60 MINS: IMG5564

## 2021-12-10 MED ORDER — LIDOCAINE VISCOUS HCL 2 % MT SOLN
OROMUCOSAL | Status: AC
  Filled 2021-12-10: qty 15

## 2021-12-10 NOTE — Procedures (Signed)
Assisted with de-clog of patient's gastrostomy tube and placed pink tape around hub of tube to maintain until new tube of correct size is available to exchange damaged gastrostomy tube. Patient was sent back to facility with documentation of what was done today when she was seen in Interventional Radiology Department at [facility].

## 2021-12-16 ENCOUNTER — Other Ambulatory Visit (HOSPITAL_COMMUNITY): Payer: Self-pay | Admitting: Interventional Radiology

## 2021-12-16 DIAGNOSIS — R633 Feeding difficulties, unspecified: Secondary | ICD-10-CM

## 2021-12-18 ENCOUNTER — Ambulatory Visit (HOSPITAL_COMMUNITY)
Admission: RE | Admit: 2021-12-18 | Discharge: 2021-12-18 | Disposition: A | Discharge status: Home / Self Care | Payer: Commercial Managed Care - HMO | Source: Ambulatory Visit | Attending: Interventional Radiology | Admitting: Interventional Radiology

## 2021-12-18 DIAGNOSIS — Z431 Encounter for attention to gastrostomy: Secondary | ICD-10-CM | POA: Insufficient documentation

## 2021-12-18 DIAGNOSIS — R633 Feeding difficulties, unspecified: Secondary | ICD-10-CM | POA: Diagnosis not present

## 2021-12-18 HISTORY — PX: IR REPLACE G-TUBE SIMPLE WO FLUORO: IMG2323

## 2021-12-18 MED ORDER — LIDOCAINE VISCOUS HCL 2 % MT SOLN
OROMUCOSAL | Status: AC
  Filled 2021-12-18: qty 15

## 2021-12-18 MED ORDER — LIDOCAINE VISCOUS HCL 2 % MT SOLN
OROMUCOSAL | Status: AC | PRN
  Administered 2021-12-18: 10 mL

## 2022-01-14 ENCOUNTER — Emergency Department (HOSPITAL_COMMUNITY)

## 2022-01-14 ENCOUNTER — Emergency Department (HOSPITAL_COMMUNITY)
Admission: EM | Admit: 2022-01-14 | Discharge: 2022-01-14 | Disposition: A | Discharge status: Skilled Nursing Facility | Attending: Student | Admitting: Student

## 2022-01-14 ENCOUNTER — Encounter (HOSPITAL_COMMUNITY): Payer: Self-pay | Admitting: Emergency Medicine

## 2022-01-14 DIAGNOSIS — Z794 Long term (current) use of insulin: Secondary | ICD-10-CM | POA: Diagnosis not present

## 2022-01-14 DIAGNOSIS — E119 Type 2 diabetes mellitus without complications: Secondary | ICD-10-CM | POA: Insufficient documentation

## 2022-01-14 DIAGNOSIS — Z87891 Personal history of nicotine dependence: Secondary | ICD-10-CM | POA: Insufficient documentation

## 2022-01-14 DIAGNOSIS — Z7982 Long term (current) use of aspirin: Secondary | ICD-10-CM | POA: Insufficient documentation

## 2022-01-14 DIAGNOSIS — F0390 Unspecified dementia without behavioral disturbance: Secondary | ICD-10-CM | POA: Insufficient documentation

## 2022-01-14 DIAGNOSIS — K9423 Gastrostomy malfunction: Secondary | ICD-10-CM | POA: Diagnosis not present

## 2022-01-14 DIAGNOSIS — I10 Essential (primary) hypertension: Secondary | ICD-10-CM | POA: Insufficient documentation

## 2022-01-14 DIAGNOSIS — Z7984 Long term (current) use of oral hypoglycemic drugs: Secondary | ICD-10-CM | POA: Insufficient documentation

## 2022-01-14 MED ORDER — IOHEXOL 300 MG/ML  SOLN
30.0000 mL | Freq: Once | INTRAMUSCULAR | Status: AC | PRN
  Administered 2022-01-14: 30 mL

## 2022-01-14 MED ORDER — OXYCODONE-ACETAMINOPHEN 5-325 MG PO TABS
1.0000 | ORAL_TABLET | Freq: Once | ORAL | Status: AC
  Administered 2022-01-14: 1 via ORAL
  Filled 2022-01-14: qty 1

## 2022-01-14 NOTE — ED Provider Notes (Signed)
[facility] DEPT Provider Note  CSN: [id number] Arrival date & time: 01/14/22 1641  Chief Complaint(s) Medical Management of Chronic Issues  HPI Christine Franklin is a 71 y.o. female who presents to the emergency department for evaluation of a dislodged G-tube.  EMS unable to provide the exact circumstances on how this G-tube was dislodged.  Patient is demented and unable to give additional history.   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[facility])    Hyperlipidemia    Hypertensive heart disease    Major depressive disorder    Obesity    PVD (peripheral vascular disease) ([place])    Patient Active Problem List   Diagnosis Date Noted   Pressure injury of skin 08/19/2019   UTI (urinary tract infection) 08/18/2019   History of CVA (cerebrovascular accident) 08/18/2019   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 Authorizing Provider  acetaminophen (TYLENOL) 325 MG tablet Place 650 mg into feeding tube every 12 (twelve) hours as needed (pain).     [provider]  Amino Acids-Protein Hydrolys (FEEDING SUPPLEMENT, PRO-STAT SUGAR FREE 64,) LIQD Place 30 mLs into feeding tube in the morning and at bedtime.    [provider]   ascorbic acid (VITAMIN C) 500 MG tablet Place 500 mg into feeding tube 2 (two) times daily.    [provider]  aspirin 81 MG chewable tablet Place 81 mg into feeding tube daily.    [provider]  atorvastatin (LIPITOR) 40 MG tablet Take 1 tablet (40 mg total) by mouth daily at 6 PM. Patient taking differently: Place 40 mg into feeding tube daily at 6 PM.  09/14/17   Gherghe, Vella Redhead, MD  Butalbital-APAP-Caffeine 50-300-40 MG CAPS Place 1 capsule into feeding tube 3 (three) times daily as needed (Migraine). Patient taking differently: Place 1 capsule into feeding tube 3 (three) times daily.  08/22/19   Lavina Hamman, MD  carvedilol (COREG) 6.25 MG tablet Place 1 tablet (6.25 mg total) into feeding tube 2 (two) times daily with a meal. Patient taking differently: Place 6.25 mg into feeding tube in the morning and at bedtime. 8am, 9pm 09/14/17   Caren Griffins, MD  Dulaglutide (TRULICITY) 5.45 GY/5.6LS SOPN Inject 0.75 mg into the skin every Wednesday.     [provider]  ferrous sulfate 220 (44 Fe) MG/5ML solution Place 220 mg into feeding tube daily.    [provider]  gabapentin (NEURONTIN) 100 MG capsule 100 mg at bedtime.     [provider]  Glucagon, rDNA, (GLUCAGON EMERGENCY) 1 MG KIT Inject 1 mg into the muscle every 8 (eight) hours as needed (blood sugar less than 60).    [provider]  HYDROcodone-acetaminophen (NORCO/VICODIN) 5-325 MG tablet Place 1  tablet into feeding tube 2 (two) times daily as needed for moderate pain. Patient taking differently: Place 1 tablet into feeding tube every 12 (twelve) hours as needed (pain).  08/22/19   Lavina Hamman, MD  hydroxypropyl methylcellulose / hypromellose (ISOPTO TEARS / GONIOVISC) 2.5 % ophthalmic solution Place 1 drop into both eyes every 6 (six) hours as needed for dry eyes.     [provider]  insulin detemir (LEVEMIR) 100 UNIT/ML injection Inject 47 Units into the skin at  bedtime.     [provider]  levETIRAcetam (KEPPRA) 100 MG/ML solution Place 10 mLs (1,000 mg total) into feeding tube 2 (two) times daily. 09/14/17   Caren Griffins, MD  Loperamide HCl (IMODIUM A-D) 1 MG/7.5ML LIQD Place 2 mg into feeding tube 2 (two) times daily.    [provider]  montelukast (SINGULAIR) 10 MG tablet Place 10 mg into feeding tube at bedtime.     [provider]  mupirocin ointment (BACTROBAN) 2 % Apply 1 application topically See admin instructions. Apply topically to peg tube sit every day and night shift for protocol    [provider]  Nutritional Supplements (FEEDING SUPPLEMENT, GLUCERNA 1.5 CAL,) LIQD Place 1,000 mLs into feeding tube See admin instructions. 60 ml/hr - start running at 2pm and run continuously until 10am for 20 hours feeding potential per day.    [provider]  nystatin (MYCOSTATIN/NYSTOP) powder Apply 1 application topically 2 (two) times daily. 12/22/19   McDonald, Mia A, PA-C  sertraline (ZOLOFT) 100 MG tablet Place 100 mg into feeding tube daily.    [provider]  Water For Irrigation, Sterile (FREE WATER) SOLN Place 200 mLs into feeding tube every 6 (six) hours. 08/22/19   Lavina Hamman, MD                                                                                                                                    Past Surgical History Past Surgical History:  Procedure Laterality Date   IR PATIENT EVAL TECH 0-60 MINS  12/10/2021   IR REPLACE G-TUBE SIMPLE WO FLUORO  04/25/2020   IR REPLACE G-TUBE SIMPLE WO FLUORO  08/02/2020   IR REPLACE G-TUBE SIMPLE WO FLUORO  12/18/2021   IR REPLC GASTRO/COLONIC TUBE PERCUT W/FLUORO  09/10/2017   IR REPLC GASTRO/COLONIC TUBE PERCUT W/FLUORO  11/13/2020   Family History History reviewed. No pertinent family history.  Social History Social History   Tobacco Use   Smoking status: Former    Packs/day: 0.50    Years: 50.00    Total pack years: 25.00     Types: Cigarettes    Quit date: 2019    Years since quitting: 4.6   Smokeless tobacco: Never  Substance Use Topics   Alcohol use: Not Currently   Drug use: Not Currently   Allergies Ace inhibitors, Contrast media [iodinated contrast media], Dust mite extract, Grapefruit  extract, Lambs quarters, Penicillins, and Shellfish allergy  Review of Systems Review of Systems  Unable to perform ROS: Dementia    Physical Exam Vital Signs  I have reviewed the triage vital signs BP (!) 169/92 (BP Location: Right Arm)   Pulse 65   Temp 98.6 F (37 C) (Oral)   Resp 16   SpO2 98%   Physical Exam Vitals and nursing note reviewed.  Constitutional:      General: She is not in acute distress.    Appearance: She is well-developed.  HENT:     Head: Normocephalic and atraumatic.  Eyes:     Conjunctiva/sclera: Conjunctivae normal.  Cardiovascular:     Rate and Rhythm: Normal rate and regular rhythm.     Heart sounds: No murmur heard. Pulmonary:     Effort: Pulmonary effort is normal. No respiratory distress.     Breath sounds: Normal breath sounds.  Abdominal:     Palpations: Abdomen is soft.     Tenderness: There is no abdominal tenderness.  Musculoskeletal:        General: No swelling.     Cervical back: Neck supple.  Skin:    General: Skin is warm and dry.     Capillary Refill: Capillary refill takes less than 2 seconds.  Neurological:     Mental Status: She is alert.  Psychiatric:        Mood and Affect: Mood normal.     ED Results and Treatments Labs (all labs ordered are listed, but only abnormal results are displayed) Labs Reviewed - No data to display                                                                                                                        Radiology No results found.  Pertinent labs & imaging results that were available during my care of the patient were reviewed by me and considered in my medical decision making (see MDM for  details).  Medications Ordered in ED Medications  iohexol (OMNIPAQUE) 300 MG/ML solution 30 mL (30 mLs Per Tube Contrast Given 01/14/22 1740)                                                                                                                                     Procedures Gastrostomy tube replacement  Date/Time: 01/14/2022 10:26 PM  Performed by: Teressa Lower, MD Authorized by: Teressa Lower, MD  Preparation: Patient was prepped and draped in the usual sterile fashion. Local anesthesia used: no  Anesthesia: Local anesthesia used: no  Sedation: Patient sedated: no  Comments: 56 French gastrostomy tube placed without difficulty     (including critical care time)  Medical Decision Making / ED Course   This patient presents to the ED for concern of G-tube dislodgment, this involves an extensive number of treatment options, and is a complaint that carries with it a high risk of complications and morbidity.  The differential diagnosis includes G-tube dislodgment, local irritation, G-tube site infection  MDM: Patient seen emergency room for evaluation of a G-tube dislodgment.  Physical exam with G-tube site clean dry and intact.  G-tube replaced at bedside without difficulty.  Confirmed with x-ray.  Patient then discharged back to facility.   Additional history obtained:  -External records from outside source obtained and reviewed including: Chart review including previous notes, labs, imaging, consultation notes   Imaging Studies ordered: I ordered imaging studies including x-ray G-tube study I independently visualized and interpreted imaging. I agree with the radiologist interpretation   Medicines ordered and prescription drug management: Meds ordered this encounter  Medications   iohexol (OMNIPAQUE) 300 MG/ML solution 30 mL    -I have reviewed the patients home medicines and have made adjustments as needed  Critical interventions none  Cardiac  Monitoring: The patient was maintained on a cardiac monitor.  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     Final Clinical Impression(s) / ED Diagnoses Final diagnoses:  None     @PCDICTATION @    Teressa Lower, MD 01/14/22 2229

## 2022-01-14 NOTE — ED Triage Notes (Signed)
Pt BIB [facility] from [facility]. Pt presents with PEG tube dislodgement. Pt is bed bound and confused from hx of CVA.   EMS Vitals  160/100 HR 72

## 2022-01-14 NOTE — ED Notes (Signed)
[facility] called for transport. JRPRN

## 2022-06-19 IMAGING — DX DG ABDOMEN 1V
1 series · 1 of 1 positions shown · non-contrast
Comparison: 02/14/2021

CLINICAL DATA: Peg tube position.

EXAM:
ABDOMEN - 1 VIEW

[abdomen kub]
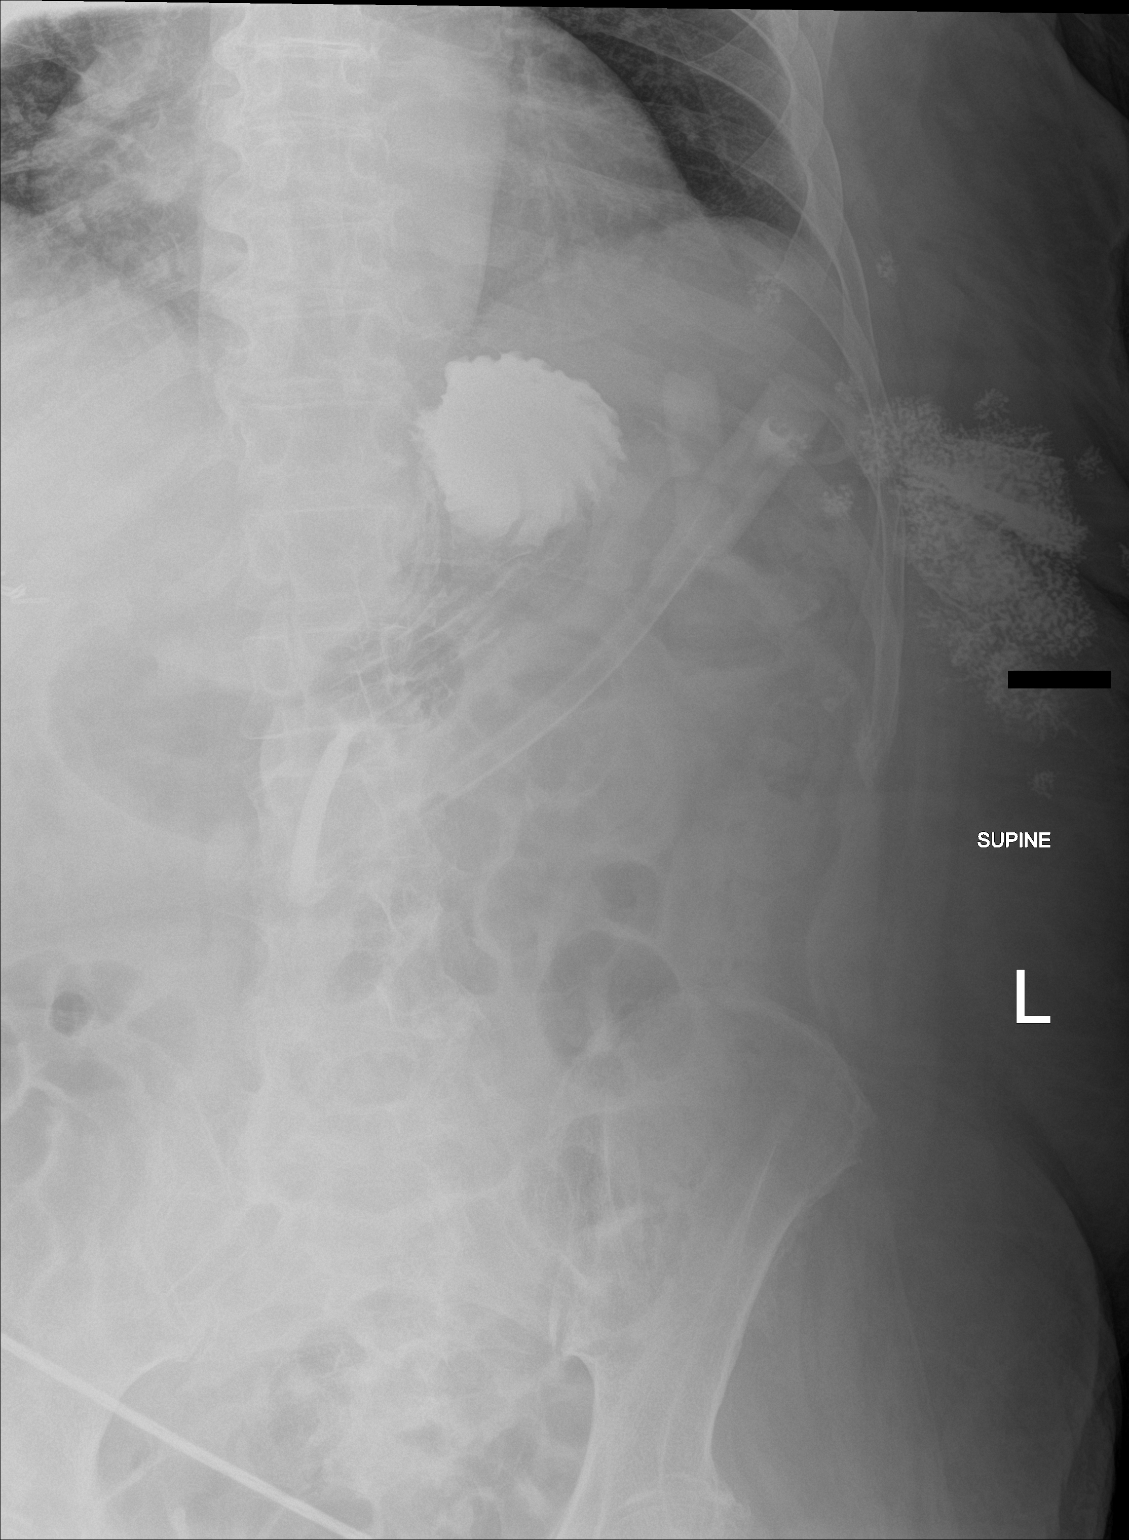

[1 of 1 positions shown; findings below may reference images not displayed]

FINDINGS: The PEG tube tip appears to be in good position in the body region
of the stomach. Contrast remains in the stomach after injection.
IMPRESSION: PEG tube position in good position without complicating features.

## 2022-08-26 ENCOUNTER — Other Ambulatory Visit (HOSPITAL_COMMUNITY): Payer: Self-pay

## 2022-08-26 ENCOUNTER — Emergency Department (HOSPITAL_COMMUNITY)
Admission: EM | Admit: 2022-08-26 | Discharge: 2022-08-26 | Disposition: A | Discharge status: Skilled Nursing Facility | Attending: Emergency Medicine | Admitting: Emergency Medicine

## 2022-08-26 ENCOUNTER — Other Ambulatory Visit: Payer: Self-pay

## 2022-08-26 ENCOUNTER — Encounter (HOSPITAL_COMMUNITY): Payer: Self-pay | Admitting: Pharmacy Technician

## 2022-08-26 DIAGNOSIS — E119 Type 2 diabetes mellitus without complications: Secondary | ICD-10-CM | POA: Insufficient documentation

## 2022-08-26 DIAGNOSIS — Z794 Long term (current) use of insulin: Secondary | ICD-10-CM | POA: Diagnosis not present

## 2022-08-26 DIAGNOSIS — R001 Bradycardia, unspecified: Secondary | ICD-10-CM | POA: Insufficient documentation

## 2022-08-26 DIAGNOSIS — Z7982 Long term (current) use of aspirin: Secondary | ICD-10-CM | POA: Insufficient documentation

## 2022-08-26 DIAGNOSIS — Z431 Encounter for attention to gastrostomy: Secondary | ICD-10-CM

## 2022-08-26 DIAGNOSIS — K9423 Gastrostomy malfunction: Secondary | ICD-10-CM

## 2022-08-26 MED ORDER — HYDROCODONE-ACETAMINOPHEN 5-325 MG PO TABS
2.0000 | ORAL_TABLET | Freq: Once | ORAL | Status: AC
  Administered 2022-08-26: 2 via ORAL
  Filled 2022-08-26: qty 2

## 2022-08-26 NOTE — ED Notes (Signed)
EMS called for transport.

## 2022-08-26 NOTE — ED Provider Notes (Signed)
[place] EMERGENCY DEPARTMENT AT [facility] Provider Note   CSN: [id number] Arrival date & time: 08/26/22  1302     History  No chief complaint on file.   Christine Franklin is a 72 y.o. female.  Patient is a 72 year old female with a history of diabetes, PVD, prior strokes resulting in being nonverbal with dysphagia requiring a G-tube who lives in a facility and presenting today due to malfunction of her G-tube.  Her daughter is present at bedside reports she sees her every day and she thinks somebody was trying to pull out the Today and toward the G-tube.  Patient has not had any of her known lunchtime medications today but otherwise daughter reports she is at baseline.  She has had the G-tube present since 2019.  The history is provided by the EMS personnel and a relative.       Home Medications Prior to Admission medications   Medication Sig Start Date End Date Taking? Authorizing Provider  acetaminophen (TYLENOL) 325 MG tablet Place 650 mg into feeding tube every 12 (twelve) hours as needed (pain).     [provider]  Amino Acids-Protein Hydrolys (FEEDING SUPPLEMENT, PRO-STAT SUGAR FREE 64,) LIQD Place 30 mLs into feeding tube in the morning and at bedtime.    [provider]  ascorbic acid (VITAMIN C) 500 MG tablet Place 500 mg into feeding tube 2 (two) times daily.    [provider]  aspirin 81 MG chewable tablet Place 81 mg into feeding tube daily.    [provider]  atorvastatin (LIPITOR) 40 MG tablet Take 1 tablet (40 mg total) by mouth daily at 6 PM. Patient taking differently: Place 40 mg into feeding tube daily at 6 PM.  09/14/17   Gherghe, Daylene Katayama, MD  Butalbital-APAP-Caffeine 50-300-40 MG CAPS Place 1 capsule into feeding tube 3 (three) times daily as needed (Migraine). Patient taking differently: Place 1 capsule into feeding tube 3 (three) times daily.  08/22/19   Rolly Salter, MD  carvedilol (COREG) 6.25 MG tablet  Place 1 tablet (6.25 mg total) into feeding tube 2 (two) times daily with a meal. Patient taking differently: Place 6.25 mg into feeding tube in the morning and at bedtime. 8am, 9pm 09/14/17   Leatha Gilding, MD  Dulaglutide (TRULICITY) 0.75 MG/0.5ML SOPN Inject 0.75 mg into the skin every Wednesday.     [provider]  ferrous sulfate 220 (44 Fe) MG/5ML solution Place 220 mg into feeding tube daily.    [provider]  gabapentin (NEURONTIN) 100 MG capsule 100 mg at bedtime.     [provider]  Glucagon, rDNA, (GLUCAGON EMERGENCY) 1 MG KIT Inject 1 mg into the muscle every 8 (eight) hours as needed (blood sugar less than 60).    [provider]  HYDROcodone-acetaminophen (NORCO/VICODIN) 5-325 MG tablet Place 1 tablet into feeding tube 2 (two) times daily as needed for moderate pain. Patient taking differently: Place 1 tablet into feeding tube every 12 (twelve) hours as needed (pain).  08/22/19   Rolly Salter, MD  hydroxypropyl methylcellulose / hypromellose (ISOPTO TEARS / GONIOVISC) 2.5 % ophthalmic solution Place 1 drop into both eyes every 6 (six) hours as needed for dry eyes.     [provider]  insulin detemir (LEVEMIR) 100 UNIT/ML injection Inject 47 Units into the skin at bedtime.     [provider]  levETIRAcetam (KEPPRA) 100 MG/ML solution Place 10 mLs (1,000 mg total) into feeding tube  2 (two) times daily. 09/14/17   Leatha Gilding, MD  Loperamide HCl (IMODIUM A-D) 1 MG/7.5ML LIQD Place 2 mg into feeding tube 2 (two) times daily.    [provider]  montelukast (SINGULAIR) 10 MG tablet Place 10 mg into feeding tube at bedtime.     [provider]  mupirocin ointment (BACTROBAN) 2 % Apply 1 application topically See admin instructions. Apply topically to peg tube sit every day and night shift for protocol    [provider]  Nutritional Supplements (FEEDING SUPPLEMENT, GLUCERNA 1.5 CAL,) LIQD Place 1,000  mLs into feeding tube See admin instructions. 60 ml/hr - start running at 2pm and run continuously until 10am for 20 hours feeding potential per day.    [provider]  nystatin (MYCOSTATIN/NYSTOP) powder Apply 1 application topically 2 (two) times daily. 12/22/19   McDonald, Mia A, PA-C  sertraline (ZOLOFT) 100 MG tablet Place 100 mg into feeding tube daily.    [provider]  Water For Irrigation, Sterile (FREE WATER) SOLN Place 200 mLs into feeding tube every 6 (six) hours. 08/22/19   Rolly Salter, MD      Allergies    Ace inhibitors, Contrast media [iodinated contrast media], Dust mite extract, Grapefruit extract, Lambs quarters, Penicillins, and Shellfish allergy    Review of Systems   Review of Systems  Physical Exam Updated Vital Signs BP (!) 181/102   Pulse (!) 52   Temp (!) 97.5 F (36.4 C) (Axillary)   Resp 16   SpO2 100%  Physical Exam Vitals and nursing note reviewed.  Cardiovascular:     Rate and Rhythm: Bradycardia present.  Pulmonary:     Effort: Pulmonary effort is normal.  Abdominal:    Skin:    General: Skin is warm.  Neurological:     Mental Status: She is alert. Mental status is at baseline.     ED Results / Procedures / Treatments   Labs (all labs ordered are listed, but only abnormal results are displayed) Labs Reviewed - No data to display  EKG None  Radiology No results found.  Procedures Gastrostomy tube replacement  Date/Time: 08/26/2022 2:31 PM  Performed by: Gwyneth Sprout, MD Authorized by: Gwyneth Sprout, MD  Consent: Verbal consent obtained. Consent given by: power of attorney Time out: Immediately prior to procedure a "time out" was called to verify the correct patient, procedure, equipment, support staff and site/side marked as required. Local anesthesia used: no  Anesthesia: Local anesthesia used: no  Sedation: Patient sedated: no  Patient tolerance: patient tolerated the procedure well with no  immediate complications Comments: Daughter reports pt had prior 5F g-tube and that was placed today with immediate return of gastric contents.  Pt has a mic g-tube with lure lock and adapters sent with the pt        Medications Ordered in ED Medications  HYDROcodone-acetaminophen (NORCO/VICODIN) 5-325 MG per tablet 2 tablet (has no administration in time range)    ED Course/ Medical Decision Making/ A&P                             Medical Decision Making Risk Prescription drug management.   Patient is here for a malfunction of her G-tube.  There is no other acute findings going on.  New G-tube was placed.  Patient is stable for discharge home.  She was given a dose of her noontime medication which is 10 mg of hydrocodone.  Final Clinical Impression(s) / ED Diagnoses Final diagnoses:  Gastrostomy tube dysfunction    Rx / DC Orders ED Discharge Orders     None         Gwyneth Sprout, MD 08/26/22 1433

## 2022-08-26 NOTE — Discharge Instructions (Signed)
New G-tube was placed.  This is a different type of G-tube that has a Luer-Lok.  The adapter was sent with the patient as well as syringes that can be used with this specific G-tube.

## 2022-08-26 NOTE — ED Triage Notes (Signed)
Pt bib ems from [facility] for peg tube replacement. Per staff, PEG is not working. Pt with hx dementia. VSS with EMS.

## 2022-08-27 ENCOUNTER — Ambulatory Visit (HOSPITAL_COMMUNITY)

## 2022-08-27 ENCOUNTER — Encounter (HOSPITAL_COMMUNITY): Payer: Self-pay

## 2022-11-21 ENCOUNTER — Emergency Department (HOSPITAL_COMMUNITY)

## 2022-11-21 ENCOUNTER — Emergency Department (HOSPITAL_COMMUNITY)
Admission: EM | Admit: 2022-11-21 | Discharge: 2022-11-21 | Disposition: A | Discharge status: Home / Self Care | Attending: Emergency Medicine | Admitting: Emergency Medicine

## 2022-11-21 ENCOUNTER — Other Ambulatory Visit: Payer: Self-pay

## 2022-11-21 DIAGNOSIS — F0390 Unspecified dementia without behavioral disturbance: Secondary | ICD-10-CM | POA: Diagnosis not present

## 2022-11-21 DIAGNOSIS — Z794 Long term (current) use of insulin: Secondary | ICD-10-CM | POA: Insufficient documentation

## 2022-11-21 DIAGNOSIS — E119 Type 2 diabetes mellitus without complications: Secondary | ICD-10-CM | POA: Insufficient documentation

## 2022-11-21 DIAGNOSIS — Z8673 Personal history of transient ischemic attack (TIA), and cerebral infarction without residual deficits: Secondary | ICD-10-CM | POA: Diagnosis not present

## 2022-11-21 DIAGNOSIS — Y732 Prosthetic and other implants, materials and accessory gastroenterology and urology devices associated with adverse incidents: Secondary | ICD-10-CM | POA: Insufficient documentation

## 2022-11-21 DIAGNOSIS — Z7982 Long term (current) use of aspirin: Secondary | ICD-10-CM | POA: Insufficient documentation

## 2022-11-21 DIAGNOSIS — T85528A Displacement of other gastrointestinal prosthetic devices, implants and grafts, initial encounter: Secondary | ICD-10-CM | POA: Diagnosis present

## 2022-11-21 NOTE — ED Provider Notes (Signed)
[place] EMERGENCY DEPARTMENT AT [facility] Provider Note  CSN: [id number] Arrival date & time: 11/21/22 1478  Chief Complaint(s) No chief complaint on file.  HPI Christine Franklin is a 72 y.o. female with a past medical history listed below including CVA and dementia who is PEG dependent here for dislodgment of her gastric tube.  This was initially placed in 2019.  She is accompanied by the daughter who reports that the nursing facility called her around 27 and told her that the PEG tube had been dislodged.  PEG tube was a 18 [place].  HPI  Past Medical History Past Medical History:  Diagnosis Date   Anemia    Aphasia    CVA (cerebral vascular accident) (HCC) 2019   Dementia (HCC)    Diabetes mellitus without complication (HCC)    Dysphagia    Epilepsy (HCC)    Functional quadriplegia (HCC)    Hyperlipidemia    Hypertensive heart disease    Major depressive disorder    Obesity    PVD (peripheral vascular disease) (HCC)    Patient Active Problem List   Diagnosis Date Noted   Pressure injury of skin 08/19/2019   UTI (urinary tract infection) 08/18/2019   History of CVA (cerebrovascular accident) 08/18/2019   Functional quadriplegia (HCC) 08/18/2019   Dehydration with hypernatremia 08/18/2019   Epilepsy (HCC)    Diabetes mellitus without complication (HCC)    Dysphagia    Hyperlipidemia    Hypertensive heart disease    Cerebral thrombosis with cerebral infarction 09/06/2017   Generalized tonic-clonic seizure (HCC) 09/03/2017   Postictal state (HCC) 09/03/2017   Myoclonic jerking 09/03/2017   Normocytic anemia 09/03/2017   Acute encephalopathy 09/02/2017   Home Medication(s) Prior to Admission medications   Medication Sig Start Date End Date Taking? Authorizing Provider  acetaminophen (TYLENOL) 325 MG tablet Place 650 mg into feeding tube every 12 (twelve) hours as needed (pain).     [provider]  Amino Acids-Protein Hydrolys (FEEDING  SUPPLEMENT, PRO-STAT SUGAR FREE 64,) LIQD Place 30 mLs into feeding tube in the morning and at bedtime.    [provider]  ascorbic acid (VITAMIN C) 500 MG tablet Place 500 mg into feeding tube 2 (two) times daily.    [provider]  aspirin 81 MG chewable tablet Place 81 mg into feeding tube daily.    [provider]  atorvastatin (LIPITOR) 40 MG tablet Take 1 tablet (40 mg total) by mouth daily at 6 PM. Patient taking differently: Place 40 mg into feeding tube daily at 6 PM.  09/14/17   Gherghe, Daylene Katayama, MD  Butalbital-APAP-Caffeine 50-300-40 MG CAPS Place 1 capsule into feeding tube 3 (three) times daily as needed (Migraine). Patient taking differently: Place 1 capsule into feeding tube 3 (three) times daily.  08/22/19   Rolly Salter, MD  carvedilol (COREG) 6.25 MG tablet Place 1 tablet (6.25 mg total) into feeding tube 2 (two) times daily with a meal. Patient taking differently: Place 6.25 mg into feeding tube in the morning and at bedtime. 8am, 9pm 09/14/17   Leatha Gilding, MD  Dulaglutide (TRULICITY) 0.75 MG/0.5ML SOPN Inject 0.75 mg into the skin every Wednesday.     [provider]  ferrous sulfate 220 (44 Fe) MG/5ML solution Place 220 mg into feeding tube daily.    [provider]  gabapentin (NEURONTIN) 100 MG capsule 100 mg at bedtime.     [provider]  Glucagon, rDNA, (GLUCAGON EMERGENCY) 1 MG KIT  Inject 1 mg into the muscle every 8 (eight) hours as needed (blood sugar less than 60).    [provider]  HYDROcodone-acetaminophen (NORCO/VICODIN) 5-325 MG tablet Place 1 tablet into feeding tube 2 (two) times daily as needed for moderate pain. Patient taking differently: Place 1 tablet into feeding tube every 12 (twelve) hours as needed (pain).  08/22/19   Rolly Salter, MD  hydroxypropyl methylcellulose / hypromellose (ISOPTO TEARS / GONIOVISC) 2.5 % ophthalmic solution Place 1 drop into both eyes every 6 (six) hours as  needed for dry eyes.     [provider]  insulin detemir (LEVEMIR) 100 UNIT/ML injection Inject 47 Units into the skin at bedtime.     [provider]  levETIRAcetam (KEPPRA) 100 MG/ML solution Place 10 mLs (1,000 mg total) into feeding tube 2 (two) times daily. 09/14/17   Leatha Gilding, MD  Loperamide HCl (IMODIUM A-D) 1 MG/7.5ML LIQD Place 2 mg into feeding tube 2 (two) times daily.    [provider]  montelukast (SINGULAIR) 10 MG tablet Place 10 mg into feeding tube at bedtime.     [provider]  mupirocin ointment (BACTROBAN) 2 % Apply 1 application topically See admin instructions. Apply topically to peg tube sit every day and night shift for protocol    [provider]  Nutritional Supplements (FEEDING SUPPLEMENT, GLUCERNA 1.5 CAL,) LIQD Place 1,000 mLs into feeding tube See admin instructions. 60 ml/hr - start running at 2pm and run continuously until 10am for 20 hours feeding potential per day.    [provider]  nystatin (MYCOSTATIN/NYSTOP) powder Apply 1 application topically 2 (two) times daily. 12/22/19   McDonald, Mia A, PA-C  sertraline (ZOLOFT) 100 MG tablet Place 100 mg into feeding tube daily.    [provider]  Water For Irrigation, Sterile (FREE WATER) SOLN Place 200 mLs into feeding tube every 6 (six) hours. 08/22/19   Rolly Salter, MD                                                                                                                                    Allergies Ace inhibitors, Contrast media [iodinated contrast media], Dust mite extract, Grapefruit extract, Lambs quarters, Penicillins, and Shellfish allergy  Review of Systems Review of Systems As noted in HPI  Physical Exam Vital Signs  I have reviewed the triage vital signs BP (!) 137/96   Pulse 64   Temp 98.8 F (37.1 C)   Resp 18   SpO2 99%   Physical Exam Vitals reviewed.  Constitutional:      General: She is not in acute  distress.    Appearance: She is well-developed. She is not diaphoretic.  HENT:     Head: Normocephalic and atraumatic.     Right Ear: External ear normal.     Left Ear: External ear normal.     Nose: Nose normal.  Eyes:  General: No scleral icterus.    Conjunctiva/sclera: Conjunctivae normal.  Neck:     Trachea: Phonation normal.  Cardiovascular:     Rate and Rhythm: Normal rate and regular rhythm.  Pulmonary:     Effort: Pulmonary effort is normal. No respiratory distress.     Breath sounds: No stridor.  Abdominal:     General: There is no distension.     Tenderness: There is no abdominal tenderness.    Musculoskeletal:        General: Normal range of motion.     Cervical back: Normal range of motion.  Neurological:     Mental Status: She is alert.     Comments: Pleasantly demented. Baseline deficits  Psychiatric:        Behavior: Behavior normal.     ED Results and Treatments Labs (all labs ordered are listed, but only abnormal results are displayed) Labs Reviewed - No data to display                                                                                                                       EKG  EKG Interpretation Date/Time:    Ventricular Rate:    PR Interval:    QRS Duration:    QT Interval:    QTC Calculation:   R Axis:      Text Interpretation:         Radiology DG ABDOMEN PEG TUBE LOCATION  Result Date: 11/21/2022 CLINICAL DATA:  Dislodged gastrostomy tube. EXAM: ABDOMEN - 1 VIEW COMPARISON:  January 14, 2022 FINDINGS: A percutaneous gastrostomy tube is seen with its distal tip and insufflator bulb overlying the body of the stomach. Radiopaque contrast is seen within the gastric body and gastric fundus. Radiopaque surgical clips are seen within the right upper quadrant. The bowel gas pattern is normal. No radio-opaque calculi or other significant radiographic abnormality are seen. IMPRESSION: Percutaneous gastrostomy tube positioning, as  described above. Electronically Signed   By: Aram Candela M.D.   On: 11/21/2022 03:29    Medications Ordered in ED Medications - No data to display Procedures Gastrostomy tube replacement  Date/Time: 11/21/2022 2:29 AM  Performed by: Nira Conn, MD Authorized by: Nira Conn, MD  Consent: Verbal consent obtained. Consent given by: guardian Patient identity confirmed: arm band Local anesthesia used: no  Anesthesia: Local anesthesia used: no  Sedation: Patient sedated: no  Patient tolerance: patient tolerated the procedure well with no immediate complications     (including critical care time) Medical Decision Making / ED Course   Medical Decision Making Amount and/or Complexity of Data Reviewed Radiology: ordered and independent interpretation performed. Decision-making details documented in ED Course.    78F G-tube replaced and confirmed with Xray.    Final Clinical Impression(s) / ED Diagnoses Final diagnoses:  Dislodged gastrostomy tube   The patient appears reasonably screened and/or stabilized for discharge and I doubt any other medical condition or other [facility] requiring further screening, evaluation, or treatment in  the ED at this time. I have discussed the findings, Dx and Tx plan with the patient/family who expressed understanding and agree(s) with the plan. Discharge instructions discussed at length. The patient/family was given strict return precautions who verbalized understanding of the instructions. No further questions at time of discharge.  Disposition: Discharge  Condition: Good  ED Discharge Orders     None         Follow Up: [name], MD [address] [place] [address] [phone]  Call  as needed    This chart was dictated using voice recognition software.  Despite best efforts to proofread,  errors can occur which can change the documentation meaning.    Nira Conn, MD 11/21/22 [phone]

## 2022-11-21 NOTE — ED Notes (Signed)
[facility] called x2 no answer.

## 2022-11-21 NOTE — ED Notes (Signed)
[facility] transport setup for pt 

## 2022-11-21 NOTE — ED Triage Notes (Signed)
Pt BIB [facility] from [facility]. Staff reports pt G tube was found completely out around 11pm.pt denies pulling it.  Pt memory deficit. Hx stroke. G tube site has fluids oozing. No visible signs of infection  150 palpated 68HR 14RR 98% RA 84 CBG

## 2022-11-21 NOTE — ED Notes (Signed)
[facility] at bedside.  This nurse called daughter to inform that patient is leaving [facility] at this time to return to facility via [facility].   Stewartt,Dynita (Daughter) [phone] (Mobile)   Contact successful.

## 2022-11-21 NOTE — Discharge Instructions (Signed)
Thank you for allowing us to take care of you today.  We hope you begin feeling better soon.  To-Do: Please follow-up with your primary doctor. Please return to the Emergency Department or call 911 if you experience chest pain, shortness of breath, severe pain, severe fever, altered mental status, or have any reason to think that you need emergency medical care.  Thank you again.  Hope you feel better soon.  Department of Emergency Medicine [place]   

## 2023-03-09 ENCOUNTER — Emergency Department (HOSPITAL_COMMUNITY)
Admission: EM | Admit: 2023-03-09 | Discharge: 2023-03-09 | Disposition: A | Discharge status: Home / Self Care | Attending: Emergency Medicine | Admitting: Emergency Medicine

## 2023-03-09 ENCOUNTER — Other Ambulatory Visit: Payer: Self-pay

## 2023-03-09 ENCOUNTER — Emergency Department (HOSPITAL_COMMUNITY)

## 2023-03-09 ENCOUNTER — Encounter (HOSPITAL_COMMUNITY): Payer: Self-pay

## 2023-03-09 DIAGNOSIS — Y732 Prosthetic and other implants, materials and accessory gastroenterology and urology devices associated with adverse incidents: Secondary | ICD-10-CM | POA: Insufficient documentation

## 2023-03-09 DIAGNOSIS — T85528A Displacement of other gastrointestinal prosthetic devices, implants and grafts, initial encounter: Secondary | ICD-10-CM | POA: Diagnosis present

## 2023-03-09 DIAGNOSIS — Z7982 Long term (current) use of aspirin: Secondary | ICD-10-CM | POA: Diagnosis not present

## 2023-03-09 DIAGNOSIS — Z794 Long term (current) use of insulin: Secondary | ICD-10-CM | POA: Diagnosis not present

## 2023-03-09 MED ORDER — IOHEXOL 300 MG/ML  SOLN
30.0000 mL | Freq: Once | INTRAMUSCULAR | Status: AC | PRN
  Administered 2023-03-09: 30 mL
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
## 2023-03-09 NOTE — Discharge Instructions (Signed)
Tube replaced, confirmed with X-ray. Follow up with your care team.

## 2023-03-09 NOTE — ED Notes (Signed)
[facility] arranged with [facility] at this time.

## 2023-07-10 DEATH — deceased
